# Patient Record
Sex: Female | Born: 1980
Health system: Southern US, Community
[De-identification: ages and names within clinical notes are randomized; demographics above are authoritative.]

## PROBLEM LIST (undated history)

## (undated) DIAGNOSIS — F32A Depression, unspecified: Secondary | ICD-10-CM

## (undated) DIAGNOSIS — K859 Acute pancreatitis without necrosis or infection, unspecified: Secondary | ICD-10-CM

## (undated) DIAGNOSIS — E119 Type 2 diabetes mellitus without complications: Secondary | ICD-10-CM

## (undated) DIAGNOSIS — G7111 Myotonic muscular dystrophy: Secondary | ICD-10-CM

## (undated) DIAGNOSIS — F329 Major depressive disorder, single episode, unspecified: Secondary | ICD-10-CM

## (undated) DIAGNOSIS — F419 Anxiety disorder, unspecified: Secondary | ICD-10-CM

## (undated) DIAGNOSIS — E785 Hyperlipidemia, unspecified: Secondary | ICD-10-CM

## (undated) HISTORY — DX: Type 2 diabetes mellitus without complications: E11.9

## (undated) HISTORY — PX: ABDOMINAL HYSTERECTOMY: SHX81

## (undated) HISTORY — DX: Anxiety disorder, unspecified: F41.9

## (undated) HISTORY — DX: Major depressive disorder, single episode, unspecified: F32.9

## (undated) HISTORY — DX: Depression, unspecified: F32.A

---

## 2004-03-21 ENCOUNTER — Emergency Department: Payer: Self-pay | Admitting: Unknown Physician Specialty

## 2009-07-12 ENCOUNTER — Emergency Department: Payer: Self-pay

## 2011-03-02 ENCOUNTER — Ambulatory Visit: Payer: Self-pay | Admitting: Obstetrics and Gynecology

## 2011-03-02 LAB — PREGNANCY, URINE: Pregnancy Test, Urine: NEGATIVE m[IU]/mL

## 2011-03-08 ENCOUNTER — Ambulatory Visit: Payer: Self-pay | Admitting: Obstetrics and Gynecology

## 2011-03-08 LAB — PREGNANCY, URINE: Pregnancy Test, Urine: NEGATIVE m[IU]/mL

## 2011-03-09 LAB — HEMOGLOBIN: HGB: 13.3 g/dL (ref 12.0–16.0)

## 2013-03-26 DIAGNOSIS — I7300 Raynaud's syndrome without gangrene: Secondary | ICD-10-CM | POA: Insufficient documentation

## 2013-03-26 DIAGNOSIS — Z5181 Encounter for therapeutic drug level monitoring: Secondary | ICD-10-CM | POA: Insufficient documentation

## 2013-03-26 DIAGNOSIS — G894 Chronic pain syndrome: Secondary | ICD-10-CM | POA: Insufficient documentation

## 2013-03-26 DIAGNOSIS — G7111 Myotonic muscular dystrophy: Secondary | ICD-10-CM | POA: Insufficient documentation

## 2013-07-23 DIAGNOSIS — F1490 Cocaine use, unspecified, uncomplicated: Secondary | ICD-10-CM | POA: Insufficient documentation

## 2013-09-29 ENCOUNTER — Encounter: Payer: Self-pay | Admitting: Neurology

## 2013-10-21 DIAGNOSIS — M1611 Unilateral primary osteoarthritis, right hip: Secondary | ICD-10-CM | POA: Insufficient documentation

## 2013-10-22 ENCOUNTER — Encounter: Payer: Self-pay | Admitting: Neurology

## 2013-12-23 ENCOUNTER — Ambulatory Visit: Payer: Self-pay | Admitting: Unknown Physician Specialty

## 2013-12-29 ENCOUNTER — Emergency Department: Payer: Self-pay | Admitting: Emergency Medicine

## 2013-12-29 LAB — COMPREHENSIVE METABOLIC PANEL
ALK PHOS: 63 U/L
ANION GAP: 2 — AB (ref 7–16)
AST: 18 U/L (ref 15–37)
Albumin: 3.8 g/dL (ref 3.4–5.0)
BUN: 11 mg/dL (ref 7–18)
Bilirubin,Total: 0.6 mg/dL (ref 0.2–1.0)
CHLORIDE: 106 mmol/L (ref 98–107)
CO2: 32 mmol/L (ref 21–32)
CREATININE: 0.77 mg/dL (ref 0.60–1.30)
Calcium, Total: 9.3 mg/dL (ref 8.5–10.1)
EGFR (African American): >60
Glucose: 115 mg/dL — ABNORMAL HIGH (ref 65–99)
OSMOLALITY: 280 (ref 275–301)
Potassium: 4.7 mmol/L (ref 3.5–5.1)
SGPT (ALT): 24 U/L
Sodium: 140 mmol/L (ref 136–145)
Total Protein: 6.8 g/dL (ref 6.4–8.2)

## 2013-12-29 LAB — CBC
HCT: 47.9 % — AB (ref 35.0–47.0)
HGB: 15.9 g/dL (ref 12.0–16.0)
MCH: 30.7 pg (ref 26.0–34.0)
MCHC: 33.2 g/dL (ref 32.0–36.0)
MCV: 92 fL (ref 80–100)
Platelet: 255 10*3/uL (ref 150–440)
RBC: 5.18 10*6/uL (ref 3.80–5.20)
RDW: 13 % (ref 11.5–14.5)
WBC: 9.6 10*3/uL (ref 3.6–11.0)

## 2014-01-22 HISTORY — PX: HIP SURGERY: SHX245

## 2014-02-09 ENCOUNTER — Ambulatory Visit: Payer: Self-pay | Admitting: Orthopedic Surgery

## 2014-03-22 DIAGNOSIS — M25851 Other specified joint disorders, right hip: Secondary | ICD-10-CM | POA: Insufficient documentation

## 2014-04-13 ENCOUNTER — Ambulatory Visit: Payer: Self-pay | Admitting: Radiology

## 2014-05-16 NOTE — Op Note (Signed)
PATIENT NAME:  Meredith Williams, WHITBY MR#:  [id number] DATE OF BIRTH:  1981-01-13  DATE OF PROCEDURE:  03/08/2011  PREOPERATIVE DIAGNOSES: Desires sterility, menorrhagia, and severe dysmenorrhea.   POSTOPERATIVE DIAGNOSES: Desires sterility, menorrhagia, and severe dysmenorrhea.   PROCEDURE: Laparoscopic total hysterectomy.   PRIMARY SURGEON: Vena Austria, MD  ASSISTANT: Thomasene Mohair, MD   ANESTHESIA: General endotracheal.  ESTIMATED BLOOD LOSS: 25 mL.  OPERATIVE FLUIDS: 800 mL of crystalloid.   IMPLANTS: None.   DRAINS OR CATHETERS: Foley to gravity drainage.   SPECIMENS REMOVED: Uterus and cervix.   COMPLICATIONS: None.   PATIENT CONDITION FOLLOWING PROCEDURE: Stable.   INTRAOPERATIVE FINDINGS: Normal-appearing nulliparous cervix, small uterus, normal tubes and ovaries bilaterally. General inspection of the intraperitoneal organs revealed no gross abnormalities.   PROCEDURE IN DETAIL: The risks, benefits, and alternatives of the procedure were discussed with the patient prior to proceeding to the OR. She voiced her desire to proceed with surgery. She was taken to the operating room and placed under general endotracheal anesthesia. The patient was then positioned in the dorsal lithotomy position and prepped and draped in the usual sterile fashion. A time out was performed. A Foley catheter was placed to drain the patient's bladder. Next, a sterile speculum was placed. The cervix was visualized. The inferior lip was grasped with a single-tooth tenaculum. A medium V-care was attempted to be placed; however, the cervix required some dilation in order to place. Upon successful placement of the V-care device, attention was turned to the patient's abdomen. A stab incision was made at the base of the umbilicus, a Veress needle was inserted, and insufflation was begun. Of note, opening pressure was noted to be 7 and the water drop test was negative. Upon establishing pneumoperitoneum, a  Visiport  5 mm trocar was placed through the base of the umbilicus. Following this two lateral 5 mm ports were placed under direct visualization. Attention was then turned to the right cornual region. The fallopian tube, round ligament, and utero-ovarian ligaments were transected using a 5 mm LigaSure device. The anterior leaf of the broad ligament was then incised down to the level of the internal cervical os. The posterior peritoneum was skeletonized as well. Next, attention was turned to the patient's left cornual region and it was dissected in a similar manner. The anterior leaf of the broad ligament was dissected down to the level of the internal cervical os, as had been done on the right. A bladder flap was created. The left uterine artery was then cauterized using bipolar energy. It was then transected using the LigaSure device. Attention was then turned to the patient's right uterine artery which was also cauterized and transected in a similar manner. The bladder blade flap was reinspected and noted to be well off the cup of the V-Care. The uterine arteries were noted to be hemostatic. Colpotomy was made using the V care cup and the LigaSure device. The specimen was transected free and removed without difficulty. Cuff closure was performed vaginally using interrupted figure of eight sutures of 0 Vicryl. Following vaginal cuff closure, vaginal exam revealed an intact cuff without defect. The vaginal cuff was also inspected from above and noted to be hemostatic, other than a small area of peritoneum that was oozing in the middle of the cuff which was coagulated with bipolar energy. The 5 mm port, on the patient's right, had been increased to a 10 mm port during the course of the procedure. This was closed using a laparoscopic closure device.  The skin on the 10 mm port site was closed using 4-0 Monocryl. The remainder of the incisions were dressed using Dermabond. Sponge, needle, and instrument counts were correct  x2. ____________________________ Florina OuAndreas M. Bonney AidStaebler, MD ams:slb D: 03/09/2011 08:35:15 ET     T: 03/09/2011 11:12:06 ET        JOB#: [id number] cc: Florina OuAndreas M. Bonney AidStaebler, MD, <Dictator> Carmel SacramentoANDREAS Cathrine MusterM Baudelia Schroepfer MD ELECTRONICALLY SIGNED 03/12/2011 13:46

## 2014-08-09 DIAGNOSIS — G43719 Chronic migraine without aura, intractable, without status migrainosus: Secondary | ICD-10-CM | POA: Insufficient documentation

## 2015-01-04 DIAGNOSIS — E781 Pure hyperglyceridemia: Secondary | ICD-10-CM | POA: Insufficient documentation

## 2015-09-27 DIAGNOSIS — E559 Vitamin D deficiency, unspecified: Secondary | ICD-10-CM | POA: Insufficient documentation

## 2015-09-27 DIAGNOSIS — E538 Deficiency of other specified B group vitamins: Secondary | ICD-10-CM | POA: Insufficient documentation

## 2015-12-07 ENCOUNTER — Other Ambulatory Visit: Payer: Self-pay | Admitting: Neurology

## 2015-12-07 DIAGNOSIS — G43719 Chronic migraine without aura, intractable, without status migrainosus: Secondary | ICD-10-CM

## 2015-12-23 ENCOUNTER — Ambulatory Visit: Payer: Medicare Other

## 2015-12-29 ENCOUNTER — Ambulatory Visit: Admission: RE | Admit: 2015-12-29 | Payer: Medicare Other | Source: Ambulatory Visit

## 2016-01-07 ENCOUNTER — Ambulatory Visit
Admission: RE | Admit: 2016-01-07 | Discharge: 2016-01-07 | Disposition: A | Discharge status: Home / Self Care | Payer: Medicare Other | Source: Ambulatory Visit | Attending: Neurology | Admitting: Neurology

## 2016-01-07 ENCOUNTER — Encounter: Payer: Self-pay | Admitting: Radiology

## 2016-01-07 DIAGNOSIS — G43719 Chronic migraine without aura, intractable, without status migrainosus: Secondary | ICD-10-CM | POA: Insufficient documentation

## 2016-01-07 MED ORDER — GADOBENATE DIMEGLUMINE 529 MG/ML IV SOLN
17.0000 mL | Freq: Once | INTRAVENOUS | Status: AC | PRN
  Administered 2016-01-07: 17 mL via INTRAVENOUS

## 2016-03-01 ENCOUNTER — Ambulatory Visit: Payer: Medicare Other | Attending: Neurology

## 2016-03-01 DIAGNOSIS — G4733 Obstructive sleep apnea (adult) (pediatric): Secondary | ICD-10-CM | POA: Insufficient documentation

## 2016-03-22 ENCOUNTER — Ambulatory Visit: Payer: Medicare Other | Attending: Neurology

## 2016-03-22 DIAGNOSIS — G4733 Obstructive sleep apnea (adult) (pediatric): Secondary | ICD-10-CM | POA: Diagnosis present

## 2016-06-15 ENCOUNTER — Emergency Department: Payer: Medicare Other

## 2016-06-15 ENCOUNTER — Encounter: Payer: Self-pay | Admitting: Intensive Care

## 2016-06-15 ENCOUNTER — Inpatient Hospital Stay
Admission: EM | Admit: 2016-06-15 | Discharge: 2016-06-17 | DRG: 439 | Disposition: A | Discharge status: Home / Self Care | Payer: Medicare Other | Attending: Internal Medicine | Admitting: Internal Medicine

## 2016-06-15 DIAGNOSIS — G710 Muscular dystrophy: Secondary | ICD-10-CM | POA: Diagnosis present

## 2016-06-15 DIAGNOSIS — E669 Obesity, unspecified: Secondary | ICD-10-CM | POA: Diagnosis present

## 2016-06-15 DIAGNOSIS — Z79899 Other long term (current) drug therapy: Secondary | ICD-10-CM

## 2016-06-15 DIAGNOSIS — D72829 Elevated white blood cell count, unspecified: Secondary | ICD-10-CM | POA: Diagnosis present

## 2016-06-15 DIAGNOSIS — Z6833 Body mass index (BMI) 33.0-33.9, adult: Secondary | ICD-10-CM

## 2016-06-15 DIAGNOSIS — Z7722 Contact with and (suspected) exposure to environmental tobacco smoke (acute) (chronic): Secondary | ICD-10-CM | POA: Diagnosis present

## 2016-06-15 DIAGNOSIS — F419 Anxiety disorder, unspecified: Secondary | ICD-10-CM | POA: Diagnosis present

## 2016-06-15 DIAGNOSIS — K859 Acute pancreatitis without necrosis or infection, unspecified: Secondary | ICD-10-CM | POA: Diagnosis not present

## 2016-06-15 DIAGNOSIS — E785 Hyperlipidemia, unspecified: Secondary | ICD-10-CM | POA: Diagnosis present

## 2016-06-15 HISTORY — DX: Myotonic muscular dystrophy: G71.11

## 2016-06-15 HISTORY — DX: Hyperlipidemia, unspecified: E78.5

## 2016-06-15 LAB — LIPASE, BLOOD: LIPASE: 353 U/L — AB (ref 11–51)

## 2016-06-15 LAB — CBC
HCT: 45.2 % (ref 35.0–47.0)
HEMOGLOBIN: 15.4 g/dL (ref 12.0–16.0)
MCH: 30.6 pg (ref 26.0–34.0)
MCHC: 34.1 g/dL (ref 32.0–36.0)
MCV: 89.7 fL (ref 80.0–100.0)
PLATELETS: 328 10*3/uL (ref 150–440)
RBC: 5.04 MIL/uL (ref 3.80–5.20)
RDW: 13.8 % (ref 11.5–14.5)
WBC: 17.3 10*3/uL — ABNORMAL HIGH (ref 3.6–11.0)

## 2016-06-15 LAB — BASIC METABOLIC PANEL
ANION GAP: 9 (ref 5–15)
BUN: 11 mg/dL (ref 6–20)
CALCIUM: 9.5 mg/dL (ref 8.9–10.3)
CO2: 26 mmol/L (ref 22–32)
CREATININE: 1.18 mg/dL — AB (ref 0.44–1.00)
Chloride: 99 mmol/L — ABNORMAL LOW (ref 101–111)
GFR calc Af Amer: >60 mL/min (ref >60)
GFR, EST NON AFRICAN AMERICAN: 59 mL/min — AB (ref >60)
Glucose, Bld: 214 mg/dL — ABNORMAL HIGH (ref 65–99)
Potassium: 4 mmol/L (ref 3.5–5.1)
SODIUM: 134 mmol/L — AB (ref 135–145)

## 2016-06-15 LAB — HEPATIC FUNCTION PANEL
ALBUMIN: 4 g/dL (ref 3.5–5.0)
ALT: 23 U/L (ref 14–54)
AST: 21 U/L (ref 15–41)
Alkaline Phosphatase: 38 U/L (ref 38–126)
BILIRUBIN DIRECT: 0.2 mg/dL (ref 0.1–0.5)
Indirect Bilirubin: 1.1 mg/dL — ABNORMAL HIGH (ref 0.3–0.9)
Total Bilirubin: 1.3 mg/dL — ABNORMAL HIGH (ref 0.3–1.2)
Total Protein: 7.7 g/dL (ref 6.5–8.1)

## 2016-06-15 LAB — LIPID PANEL
CHOL/HDL RATIO: 3.8 ratio
Cholesterol: 168 mg/dL (ref 0–200)
HDL: 44 mg/dL (ref >40)
LDL CALC: 67 mg/dL (ref 0–99)
Triglycerides: 285 mg/dL — ABNORMAL HIGH (ref <150)
VLDL: 57 mg/dL — ABNORMAL HIGH (ref 0–40)

## 2016-06-15 LAB — TROPONIN I

## 2016-06-15 LAB — LACTIC ACID, PLASMA: LACTIC ACID, VENOUS: 0.9 mmol/L (ref 0.5–1.9)

## 2016-06-15 MED ORDER — HEPARIN SODIUM (PORCINE) 5000 UNIT/ML IJ SOLN
5000.0000 [IU] | Freq: Three times a day (TID) | INTRAMUSCULAR | Status: DC
  Filled 2016-06-15 (×2): qty 1

## 2016-06-15 MED ORDER — BUSPIRONE HCL 10 MG PO TABS
15.0000 mg | ORAL_TABLET | Freq: Two times a day (BID) | ORAL | Status: DC
  Filled 2016-06-15: qty 2

## 2016-06-15 MED ORDER — IOPAMIDOL (ISOVUE-300) INJECTION 61%
30.0000 mL | Freq: Once | INTRAVENOUS | Status: AC | PRN
  Administered 2016-06-15: 30 mL via ORAL

## 2016-06-15 MED ORDER — IOPAMIDOL (ISOVUE-300) INJECTION 61%
100.0000 mL | Freq: Once | INTRAVENOUS | Status: AC | PRN
  Administered 2016-06-15: 100 mL via INTRAVENOUS

## 2016-06-15 MED ORDER — SODIUM CHLORIDE 0.9 % IV BOLUS (SEPSIS)
1000.0000 mL | Freq: Once | INTRAVENOUS | Status: AC
  Administered 2016-06-15: 1000 mL via INTRAVENOUS

## 2016-06-15 MED ORDER — IOPAMIDOL (ISOVUE-300) INJECTION 61%: 100.0000 mL | Freq: Once | INTRAVENOUS | Status: DC | PRN

## 2016-06-15 MED ORDER — DULOXETINE HCL 60 MG PO CPEP
60.0000 mg | ORAL_CAPSULE | Freq: Two times a day (BID) | ORAL | Status: DC
  Filled 2016-06-15: qty 1

## 2016-06-15 MED ORDER — FENOFIBRATE 160 MG PO TABS
160.0000 mg | ORAL_TABLET | Freq: Every day | ORAL | Status: DC
  Filled 2016-06-15 (×2): qty 1

## 2016-06-15 MED ORDER — MORPHINE SULFATE (PF) 4 MG/ML IV SOLN
INTRAVENOUS | Status: AC
  Filled 2016-06-15: qty 1

## 2016-06-15 MED ORDER — GABAPENTIN 300 MG PO CAPS
900.0000 mg | ORAL_CAPSULE | Freq: Two times a day (BID) | ORAL | Status: DC
  Filled 2016-06-15: qty 3

## 2016-06-15 MED ORDER — DOCUSATE SODIUM 100 MG PO CAPS: 100.0000 mg | ORAL_CAPSULE | Freq: Two times a day (BID) | ORAL | Status: DC | PRN

## 2016-06-15 MED ORDER — MORPHINE SULFATE (PF) 4 MG/ML IV SOLN
4.0000 mg | Freq: Once | INTRAVENOUS | Status: AC
  Administered 2016-06-15: 4 mg via INTRAVENOUS
  Filled 2016-06-15: qty 1

## 2016-06-15 MED ORDER — ACETAMINOPHEN 325 MG PO TABS
650.0000 mg | ORAL_TABLET | Freq: Four times a day (QID) | ORAL | Status: DC | PRN
  Administered 2016-06-15: 650 mg via ORAL
  Filled 2016-06-15: qty 2

## 2016-06-15 MED ORDER — ONDANSETRON HCL 4 MG/2ML IJ SOLN
4.0000 mg | Freq: Four times a day (QID) | INTRAMUSCULAR | Status: DC | PRN
  Administered 2016-06-17: 4 mg via INTRAVENOUS
  Filled 2016-06-15: qty 2

## 2016-06-15 MED ORDER — ZOLPIDEM TARTRATE 5 MG PO TABS: 5.0000 mg | ORAL_TABLET | Freq: Every evening | ORAL | Status: DC | PRN

## 2016-06-15 MED ORDER — CYCLOBENZAPRINE HCL 10 MG PO TABS: 15.0000 mg | ORAL_TABLET | Freq: Two times a day (BID) | ORAL | Status: DC | PRN

## 2016-06-15 MED ORDER — SODIUM CHLORIDE 0.9 % IV SOLN
INTRAVENOUS | Status: DC
  Administered 2016-06-15 – 2016-06-17 (×6): via INTRAVENOUS

## 2016-06-15 MED ORDER — MORPHINE SULFATE (PF) 2 MG/ML IV SOLN
2.0000 mg | Freq: Once | INTRAVENOUS | Status: AC
  Administered 2016-06-15: 2 mg via INTRAVENOUS

## 2016-06-15 MED ORDER — MORPHINE SULFATE (PF) 2 MG/ML IV SOLN
2.0000 mg | INTRAVENOUS | Status: DC | PRN
  Administered 2016-06-15 – 2016-06-17 (×10): 2 mg via INTRAVENOUS
  Filled 2016-06-15 (×10): qty 1

## 2016-06-15 NOTE — H&P (Signed)
[facility] [place] [facility]   PATIENT NAME: Meredith BucklerSarah Williams    MR#:  [id number]  DATE OF BIRTH:  17-Aug-1980  DATE OF ADMISSION:  06/15/2016  PRIMARY CARE PHYSICIAN: Marisue IvanLinthavong, Kanhka, MD   REQUESTING/REFERRING PHYSICIAN: Quale  CHIEF COMPLAINT:   Chief Complaint  Patient presents with  . Chest Pain    HISTORY OF PRESENT ILLNESS: Meredith Williams  is a 36 y.o. female with a known history of Hyperlipidemia, anxiety and muscular dystrophy- since yesterday started having some pain and nausea in her abdomen, the pain is nonradiating and she does not have any associated fever or chills. She denies any diarrhea. Concerned with this she came to emergency room and her lipase was elevated and CT scan of the abdomen showed acute pancreatitis. She denies drinking regular alcohol.  PAST MEDICAL HISTORY:   Past Medical History:  Diagnosis Date  . Hyperlipidemia   . Muscular dystrophy, myotonic (HCC)     PAST SURGICAL HISTORY: History reviewed. No pertinent surgical history.  SOCIAL HISTORY:  Social History  Substance Use Topics  . Smoking status: Passive Smoke Exposure - Never Smoker  . Smokeless tobacco: Never Used  . Alcohol use 2.4 oz/week    4 Shots of liquor per week    FAMILY HISTORY:  Family History  Problem Relation Age of Onset  . Stroke Mother     DRUG ALLERGIES: No Known Allergies  REVIEW OF SYSTEMS:   CONSTITUTIONAL: No fever, fatigue or weakness.  EYES: No blurred or double vision.  EARS, NOSE, AND THROAT: No tinnitus or ear pain.  RESPIRATORY: No cough, shortness of breath, wheezing or hemoptysis.  CARDIOVASCULAR: No chest pain, orthopnea, edema.  GASTROINTESTINAL: No nausea, vomiting, diarrhea , have abdominal pain.  GENITOURINARY: No dysuria, hematuria.  ENDOCRINE: No polyuria, nocturia,  HEMATOLOGY: No anemia, easy bruising or bleeding SKIN: No rash or lesion. MUSCULOSKELETAL: No joint pain or arthritis.   NEUROLOGIC: No tingling, numbness,  weakness.  PSYCHIATRY: No anxiety or depression.   MEDICATIONS AT HOME:  Prior to Admission medications   Medication Sig Start Date End Date Taking? Authorizing Provider  busPIRone (BUSPAR) 15 MG tablet Take 15 mg by mouth 2 (two) times daily. 06/06/16  Yes [provider]  cyclobenzaprine (FLEXERIL) 10 MG tablet Take 15 mg by mouth 2 (two) times daily as needed for muscle spasms. 05/29/16  Yes [provider]  DULoxetine (CYMBALTA) 60 MG capsule Take 60 mg by mouth 2 (two) times daily. 04/18/16  Yes [provider]  fenofibrate 160 MG tablet Take 160 mg by mouth daily. 06/05/16  Yes [provider]  gabapentin (NEURONTIN) 300 MG capsule Take 900 mg by mouth 2 (two) times daily.  05/25/16  Yes [provider]  zolpidem (AMBIEN) 5 MG tablet Take 5 mg by mouth at bedtime. 05/21/16  Yes [provider]      PHYSICAL EXAMINATION:   VITAL SIGNS: Blood pressure 106/68, pulse (!) 118, temperature 99.1 F (37.3 C), temperature source Oral, resp. rate 20, height 5\' 5"  (1.651 m), weight 92 kg (202 lb 14.4 oz), SpO2 91 %.  GENERAL:  36 y.o.-year-old patient lying in the bed with no acute distress.  EYES: Pupils equal, round, reactive to light and accommodation. No scleral icterus. Extraocular muscles intact.  HEENT: Head atraumatic, normocephalic. Oropharynx and nasopharynx clear.  NECK:  Supple, no jugular venous distention. No thyroid enlargement, no tenderness.  LUNGS: Normal breath sounds bilaterally, no wheezing, rales,rhonchi or crepitation. No use of accessory muscles  of respiration.  CARDIOVASCULAR: S1, S2 normal. No murmurs, rubs, or gallops.  ABDOMEN: Soft, Epigastric tender, nondistended. Bowel sounds present. No organomegaly or mass.  EXTREMITIES: No pedal edema, cyanosis, or clubbing.  NEUROLOGIC: Cranial nerves II through XII are intact. Muscle strength 5/5 in all extremities. Sensation intact. Gait not checked.  PSYCHIATRIC: The patient  is alert and oriented x 3.  SKIN: No obvious rash, lesion, or ulcer.   LABORATORY PANEL:   CBC  Recent Labs Lab 06/15/16 0748  WBC 17.3*  HGB 15.4  HCT 45.2  PLT 328  MCV 89.7  MCH 30.6  MCHC 34.1  RDW 13.8   ------------------------------------------------------------------------------------------------------------------  Chemistries   Recent Labs Lab 06/15/16 0748  NA 134*  K 4.0  CL 99*  CO2 26  GLUCOSE 214*  BUN 11  CREATININE 1.18*  CALCIUM 9.5  AST 21  ALT 23  ALKPHOS 38  BILITOT 1.3*   ------------------------------------------------------------------------------------------------------------------ estimated creatinine clearance is 74.6 mL/min (A) (by C-G formula based on SCr of 1.18 mg/dL (H)). ------------------------------------------------------------------------------------------------------------------ No results for input(s): TSH, T4TOTAL, T3FREE, THYROIDAB in the last 72 hours.  Invalid input(s): FREET3   Coagulation profile No results for input(s): INR, PROTIME in the last 168 hours. ------------------------------------------------------------------------------------------------------------------- No results for input(s): DDIMER in the last 72 hours. -------------------------------------------------------------------------------------------------------------------  Cardiac Enzymes  Recent Labs Lab 06/15/16 0748  TROPONINI <0.03   ------------------------------------------------------------------------------------------------------------------ Invalid input(s): POCBNP  ---------------------------------------------------------------------------------------------------------------  Urinalysis No results found for: COLORURINE, APPEARANCEUR, LABSPEC, PHURINE, GLUCOSEU, HGBUR, BILIRUBINUR, KETONESUR, PROTEINUR, UROBILINOGEN, NITRITE, LEUKOCYTESUR   RADIOLOGY: Dg Chest 2 View  Result Date: 06/15/2016 CLINICAL DATA:  Pt having chest pain  under left breast that radiates down her left side since Wednesday. Hx of bronchitis, muscular dystrophy. Non smoker. She has a nipple ring but couldn't take it out. EXAM: CHEST - 2 VIEW COMPARISON:  none FINDINGS: Relatively low lung volumes. Coarse airspace consolidation in basilar segments of the left lower lobe with probable small effusion. There is some subsegmental atelectasis/ consolidation in the right infrahilar region as well. Linear scarring or subsegmental atelectasis in the right middle lobe and lingula. Heart size and mediastinal contours are within normal limits. No pneumothorax. Visualized bones unremarkable. IMPRESSION: 1. Bibasilar airspace disease, left greater than right. 2. Small Left pleural effusion. Electronically Signed   By: Corlis Leak M.D.   On: 06/15/2016 08:10   Ct Abdomen Pelvis W Contrast  Result Date: 06/15/2016 CLINICAL DATA:  Left chest/ upper abdominal pain EXAM: CT ABDOMEN AND PELVIS WITH CONTRAST TECHNIQUE: Multidetector CT imaging of the abdomen and pelvis was performed using the standard protocol following bolus administration of intravenous contrast. CONTRAST:  ISOVUE-300 IOPAMIDOL (ISOVUE-300) INJECTION 61% COMPARISON:  None. FINDINGS: Lower chest: Mild patchy opacities in the bilateral lower lobes, likely atelectasis. Hepatobiliary: Liver is within normal limits. Gallbladder is unremarkable. No intrahepatic or extrahepatic ductal dilatation. Pancreas: Peripancreatic inflammatory changes, reflecting acute pancreatitis. No evidence of pancreatic necrosis. Peripancreatic fluid along the pancreatic tail (series 2/ image 27), without discrete pancreatic pseudocyst. Spleen: Within normal limits. Adrenals/Urinary Tract: Adrenal glands within normal limits. Severe left renal cortical scarring/ atrophy. Right kidney is within normal limits. No hydronephrosis. Bladder is within normal limits. Stomach/Bowel: Stomach is within normal limits. No evidence of bowel obstruction.  Normal appendix (series 2/ image 68). Vascular/Lymphatic: No evidence of abdominal aortic aneurysm. No suspicious abdominopelvic lymphadenopathy. Reproductive: Status post hysterectomy. Bilateral ovaries are within normal limits. Other: Peripancreatic fluid, as noted above. Otherwise, no abdominopelvic ascites. Musculoskeletal: Visualized osseous structures are within normal  limits. IMPRESSION: Acute uncomplicated pancreatitis. Electronically Signed   By: Charline Bills M.D.   On: 06/15/2016 11:35    EKG: Orders placed or performed during the hospital encounter of 06/15/16  . EKG 12-Lead  . EKG 12-Lead  . ED EKG within 10 minutes  . ED EKG within 10 minutes    IMPRESSION AND PLAN:  * Acute pancreatitis   IV fluid and pain management with   Nausea medicine as needed.   Check lipid panel.  * Anxiety   Muscular dystrophy    Continue home medications.   All the records are reviewed and case discussed with ED provider. Management plans discussed with the patient, family and they are in agreement.  CODE STATUS: Full code    Code Status Orders        Start     Ordered   06/15/16 1529  Full code  Continuous     06/15/16 1528    Code Status History    Date Active Date Inactive Code Status Order ID Comments User Context   This patient has a current code status but no historical code status.       TOTAL TIME TAKING CARE OF THIS PATIENT: 50 minutes.    Altamese Dilling M.D on 06/15/2016   Between 7am to 6pm - Pager - [phone]  After 6pm go to [email or web address] - password [email or web address]  [facility]  Office  [phone]  CC: Primary care physician; Marisue Ivan, MD   Note: This dictation was prepared with Dragon dictation along with smaller phrase technology. Any transcriptional errors that result from this process are unintentional.

## 2016-06-15 NOTE — ED Provider Notes (Signed)
[facility] Emergency Department Provider Note   ____________________________________________   First MD Initiated Contact with Patient 06/15/16 0820     (approximate)  I have reviewed the triage vital signs and the nursing notes.   HISTORY  Chief Complaint Left-sided abdominal pain   HPI Meredith Williams is a 36 y.o. female and reports to me that she's been having increasing pain in the left mid to upper abdomen. She reports that pain and discomfort starts at the lower edge of her left breast but runs all the way down to the left lower pelvis. Been slowly increasing over the last day. Associated with mild nausea and decreased appetite. No fevers or vomiting. No diarrhea.  As she does report that she did mention that her chest was slightly uncomfortable and the pain starts in the area of the breast, but she reports her pain really is primarily in the left side of the abdomen. No shortness of breath. No cough.  Moderate, somewhat sharp pain with movement.  Prior left hip surgery, prior hysterectomy. Has muscular dystrophy.   Past Medical History:  Diagnosis Date  . Hyperlipidemia   . Muscular dystrophy, myotonic (HCC)     There are no active problems to display for this patient.   History reviewed. No pertinent surgical history.  Prior to Admission medications   Medication Sig Start Date End Date Taking? Authorizing Provider  busPIRone (BUSPAR) 15 MG tablet Take 15 mg by mouth 2 (two) times daily. 06/06/16  Yes [provider]  cyclobenzaprine (FLEXERIL) 10 MG tablet Take 15 mg by mouth 2 (two) times daily as needed for muscle spasms. 05/29/16  Yes [provider]  DULoxetine (CYMBALTA) 60 MG capsule Take 60 mg by mouth 2 (two) times daily. 04/18/16  Yes [provider]  fenofibrate 160 MG tablet Take 160 mg by mouth daily. 06/05/16  Yes [provider]  gabapentin (NEURONTIN) 300 MG capsule Take 900 mg by mouth 2 (two)  times daily.  05/25/16  Yes [provider]  zolpidem (AMBIEN) 5 MG tablet Take 5 mg by mouth at bedtime. 05/21/16  Yes [provider]    Allergies Patient has no known allergies.  History reviewed. No pertinent family history.  Social History Social History  Substance Use Topics  . Smoking status: Passive Smoke Exposure - Never Smoker  . Smokeless tobacco: Never Used  . Alcohol use 2.4 oz/week    4 Shots of liquor per week    Review of Systems Constitutional: No fever/chills. Some "hot flashes" in the evening Eyes: No visual changes. ENT: No sore throat. Cardiovascular: Denies chest pain. Respiratory: Denies shortness of breath. Gastrointestinal: No vomiting.  No diarrhea.  No constipation. Genitourinary: Negative for dysuria. Musculoskeletal: Negative for back pain. Skin: Negative for rash. Neurological: Negative for headaches, focal weakness or numbness.  10-point ROS otherwise negative.  ____________________________________________   PHYSICAL EXAM:  VITAL SIGNS: ED Triage Vitals  Enc Vitals Group     BP 06/15/16 0738 114/73     Pulse Rate 06/15/16 0738 (!) 139     Resp 06/15/16 0738 20     Temp 06/15/16 0738 99.5 F (37.5 C)     Temp Source 06/15/16 0738 Oral     SpO2 06/15/16 0738 90 %     Weight 06/15/16 0738 210 lb (95.3 kg)     Height 06/15/16 0738 5\' 5"  (1.651 m)     Head Circumference --      Peak Flow --  Pain Score 06/15/16 0747 10     Pain Loc --      Pain Edu? --      Excl. in GC? --     Constitutional: Alert and oriented. Well appearing and in no acute distress.She is very pleasant and is here with her father. Eyes: Conjunctivae are normal. PERRL. EOMI. Head: Atraumatic. Nose: No congestion/rhinnorhea. Mouth/Throat: Mucous membranes are slightly dry.  Oropharynx non-erythematous. Neck: No stridor.  No meningismus Cardiovascular: Tachycardic rate, regular rhythm. Grossly normal heart sounds.  Good peripheral  circulation. Respiratory: Normal respiratory effort.  No retractions. Lungs CTAB. Gastrointestinal: Soft and nontender over the right, but notably tender with some mild rebound peritonitis over the entirety of the left side of the abdomen, perhaps slightly worse over the left flank and left upper quadrant. No distention. No abdominal bruits. No CVA tenderness. Musculoskeletal: No lower extremity tenderness nor edema.  No joint effusions. Neurologic:  Normal speech and language. No gross focal neurologic deficits are appreciated.  Skin:  Skin is warm, dry and intact. No rash noted. Psychiatric: Mood and affect are normal. Speech and behavior are normal.  ____________________________________________   LABS (all labs ordered are listed, but only abnormal results are displayed)  Labs Reviewed  BASIC METABOLIC PANEL - Abnormal; Notable for the following:       Result Value   Sodium 134 (*)    Chloride 99 (*)    Glucose, Bld 214 (*)    Creatinine, Ser 1.18 (*)    GFR calc non Af Amer 59 (*)    All other components within normal limits  CBC - Abnormal; Notable for the following:    WBC 17.3 (*)    All other components within normal limits  HEPATIC FUNCTION PANEL - Abnormal; Notable for the following:    Total Bilirubin 1.3 (*)    Indirect Bilirubin 1.1 (*)    All other components within normal limits  LIPASE, BLOOD - Abnormal; Notable for the following:    Lipase 353 (*)    All other components within normal limits  TROPONIN I  LACTIC ACID, PLASMA  LACTIC ACID, PLASMA  URINALYSIS, COMPLETE (UACMP) WITH MICROSCOPIC   ____________________________________________  EKG  Reviewed and interpreted by me at 7:40 AM Ventricular rate 1:30 QRS 90 QTc 4:30 Sinus tachycardia, no evidence of acute ischemia is noted. ____________________________________________  RADIOLOGY  Dg Chest 2 View  Result Date: 06/15/2016 CLINICAL DATA:  Pt having chest pain under left breast that radiates down  her left side since Wednesday. Hx of bronchitis, muscular dystrophy. Non smoker. She has a nipple ring but couldn't take it out. EXAM: CHEST - 2 VIEW COMPARISON:  none FINDINGS: Relatively low lung volumes. Coarse airspace consolidation in basilar segments of the left lower lobe with probable small effusion. There is some subsegmental atelectasis/ consolidation in the right infrahilar region as well. Linear scarring or subsegmental atelectasis in the right middle lobe and lingula. Heart size and mediastinal contours are within normal limits. No pneumothorax. Visualized bones unremarkable. IMPRESSION: 1. Bibasilar airspace disease, left greater than right. 2. Small Left pleural effusion. Electronically Signed   By: Corlis Leak M.D.   On: 06/15/2016 08:10   Ct Abdomen Pelvis W Contrast  Result Date: 06/15/2016 CLINICAL DATA:  Left chest/ upper abdominal pain EXAM: CT ABDOMEN AND PELVIS WITH CONTRAST TECHNIQUE: Multidetector CT imaging of the abdomen and pelvis was performed using the standard protocol following bolus administration of intravenous contrast. CONTRAST:  ISOVUE-300 IOPAMIDOL (ISOVUE-300) INJECTION 61% COMPARISON:  None. FINDINGS: Lower chest: Mild patchy opacities in the bilateral lower lobes, likely atelectasis. Hepatobiliary: Liver is within normal limits. Gallbladder is unremarkable. No intrahepatic or extrahepatic ductal dilatation. Pancreas: Peripancreatic inflammatory changes, reflecting acute pancreatitis. No evidence of pancreatic necrosis. Peripancreatic fluid along the pancreatic tail (series 2/ image 27), without discrete pancreatic pseudocyst. Spleen: Within normal limits. Adrenals/Urinary Tract: Adrenal glands within normal limits. Severe left renal cortical scarring/ atrophy. Right kidney is within normal limits. No hydronephrosis. Bladder is within normal limits. Stomach/Bowel: Stomach is within normal limits. No evidence of bowel obstruction. Normal appendix (series 2/ image 68).  Vascular/Lymphatic: No evidence of abdominal aortic aneurysm. No suspicious abdominopelvic lymphadenopathy. Reproductive: Status post hysterectomy. Bilateral ovaries are within normal limits. Other: Peripancreatic fluid, as noted above. Otherwise, no abdominopelvic ascites. Musculoskeletal: Visualized osseous structures are within normal limits. IMPRESSION: Acute uncomplicated pancreatitis. Electronically Signed   By: Charline Bills M.D.   On: 06/15/2016 11:35    ____________________________________________   PROCEDURES  Procedure(s) performed: None  Procedures  Critical Care performed: No  ____________________________________________   INITIAL IMPRESSION / ASSESSMENT AND PLAN / ED COURSE  Pertinent labs & imaging results that were available during my care of the patient were reviewed by me and considered in my medical decision making (see chart for details).  Differential diagnosis includes but is not limited to, abdominal perforation, aortic dissection, cholecystitis, appendicitis, diverticulitis, colitis, esophagitis/gastritis, kidney stone, pyelonephritis, urinary tract infection, aortic aneurysm. All are considered in decision and treatment plan. Based upon the patient's presentation and risk factors, and plan to proceed with CT scan of the abdomen and pelvis to further evaluate given the patient's concerns for possible peritonitis  ----------------------------------------- 12:15 PM on 06/15/2016 -----------------------------------------  Patient with ongoing tachycardia, pain started to return. She is awake and alert in no acute distress. Discussed with the patient I will admit her for further workup of her acute pancreatitis. Somewhat unclear as to the cause, she does use alcohol the last reports using about 2 weeks ago and does not drink daily. She does report history of elevated cholesterol and I'll check a lipid panel. I'm not certain if this could represent infectious  etiology, though the patient does not have a fever, denies frank associated infectious symptoms.  Admitted to hospitalist service for further evaluation and workup.       ____________________________________________   FINAL CLINICAL IMPRESSION(S) / ED DIAGNOSES  Final diagnoses:  Acute pancreatitis, unspecified complication status, unspecified pancreatitis type      NEW MEDICATIONS STARTED DURING THIS VISIT:  New Prescriptions   No medications on file     Note:  This document was prepared using Dragon voice recognition software and may include unintentional dictation errors.     Sharyn Creamer, MD 06/15/16 1216

## 2016-06-15 NOTE — ED Notes (Signed)
Pts o2 sats dropped to 79-82% on RA. Pt was instructed to sit up and take deep breaths without any improvement of sats. Pt was placed on 3L [place] and sats slowly came up to mid 90's. MD made aware.

## 2016-06-15 NOTE — ED Triage Notes (Signed)
Patient presents today with chest pain under L breast that radiates down to abdomen that started yesterday. Reports pain is so bad she couldn't sleep last night. HX myotonic muscular dystrophy. Diaphoretic in triage. Wears C-PAP at night. Denies SOB.

## 2016-06-16 LAB — BASIC METABOLIC PANEL
Anion gap: 5 (ref 5–15)
BUN: 7 mg/dL (ref 6–20)
CALCIUM: 8.1 mg/dL — AB (ref 8.9–10.3)
CHLORIDE: 106 mmol/L (ref 101–111)
CO2: 28 mmol/L (ref 22–32)
CREATININE: 0.89 mg/dL (ref 0.44–1.00)
GFR calc Af Amer: >60 mL/min (ref >60)
GFR calc non Af Amer: >60 mL/min (ref >60)
Glucose, Bld: 137 mg/dL — ABNORMAL HIGH (ref 65–99)
Potassium: 3.8 mmol/L (ref 3.5–5.1)
Sodium: 139 mmol/L (ref 135–145)

## 2016-06-16 LAB — CBC
HCT: 36.6 % (ref 35.0–47.0)
Hemoglobin: 12.4 g/dL (ref 12.0–16.0)
MCH: 31.1 pg (ref 26.0–34.0)
MCHC: 34 g/dL (ref 32.0–36.0)
MCV: 91.3 fL (ref 80.0–100.0)
PLATELETS: 253 10*3/uL (ref 150–440)
RBC: 4 MIL/uL (ref 3.80–5.20)
RDW: 13.6 % (ref 11.5–14.5)
WBC: 11.5 10*3/uL — AB (ref 3.6–11.0)

## 2016-06-16 LAB — LIPASE, BLOOD: Lipase: 101 U/L — ABNORMAL HIGH (ref 11–51)

## 2016-06-16 NOTE — Progress Notes (Addendum)
[facility] [place] at [facility]   PATIENT NAME: Meredith BucklerSarah Williams    MR#:  [id number]  DATE OF BIRTH:  04/14/1980  SUBJECTIVE:  CHIEF COMPLAINT:   Chief Complaint  Patient presents with  . Chest Pain   Still abdominal pain and nausea, no vomiting or diarrhea. REVIEW OF SYSTEMS:  Review of Systems  Constitutional: Negative for chills, fever and malaise/fatigue.  HENT: Negative for sore throat.   Eyes: Negative for blurred vision and double vision.  Respiratory: Negative for cough, shortness of breath, wheezing and stridor.   Cardiovascular: Negative for chest pain, palpitations and leg swelling.  Gastrointestinal: Positive for abdominal pain and nausea. Negative for blood in stool, diarrhea, melena and vomiting.  Genitourinary: Negative for dysuria and hematuria.  Musculoskeletal: Negative for back pain.  Neurological: Negative for dizziness, focal weakness, loss of consciousness, weakness and headaches.  Psychiatric/Behavioral: Negative for depression. The patient is not nervous/anxious.     DRUG ALLERGIES:  No Known Allergies VITALS:  Blood pressure 102/64, pulse (!) 121, temperature 99.9 F (37.7 C), temperature source Oral, resp. rate 20, height 5\' 5"  (1.651 m), weight 202 lb 14.4 oz (92 kg), SpO2 92 %. PHYSICAL EXAMINATION:  Physical Exam  Constitutional: She is oriented to person, place, and time. No distress.  Obese.  HENT:  Head: Normocephalic.  Mouth/Throat: Oropharynx is clear and moist.  Eyes: Conjunctivae and EOM are normal. No scleral icterus.  Neck: Normal range of motion. Neck supple. No JVD present. No tracheal deviation present.  Cardiovascular: Normal rate, regular rhythm and normal heart sounds.  Exam reveals no gallop.   No murmur heard. Pulmonary/Chest: Effort normal and breath sounds normal. No respiratory distress. She has no wheezes. She has no rales.  Abdominal: Soft. She exhibits no distension. There is tenderness. There is no  rebound and no guarding.  Abdominal pain on the middle and the left side.  Musculoskeletal: Normal range of motion. She exhibits no edema or tenderness.  Neurological: She is alert and oriented to person, place, and time. No cranial nerve deficit.  Skin: No rash noted. No erythema.  Psychiatric: Affect normal.   LABORATORY PANEL:  Female CBC  Recent Labs Lab 06/16/16 0327  WBC 11.5*  HGB 12.4  HCT 36.6  PLT 253   ------------------------------------------------------------------------------------------------------------------ Chemistries   Recent Labs Lab 06/15/16 0748 06/16/16 0327  NA 134* 139  K 4.0 3.8  CL 99* 106  CO2 26 28  GLUCOSE 214* 137*  BUN 11 7  CREATININE 1.18* 0.89  CALCIUM 9.5 8.1*  AST 21  --   ALT 23  --   ALKPHOS 38  --   BILITOT 1.3*  --    RADIOLOGY:  No results found. ASSESSMENT AND PLAN:   * Acute pancreatitis CT abdomen and pelvis show acute uncomplicated pancreatitis.   Start full liquid diet, continue IV fluid and pain management with   Nausea medicine as needed. Follow-up lipase in a.m.  Leukocytosis, due to reaction, improved.  *HLP. TG 285, LDL 67.  Obesity.  All the records are reviewed and case discussed with Care Management/Social Worker. Management plans discussed with the patient, family and they are in agreement.  CODE STATUS: Full Code  TOTAL TIME TAKING CARE OF THIS PATIENT: 27 minutes.   More than 50% of the time was spent in counseling/coordination of care: YES  POSSIBLE D/C IN 1-2 DAYS, DEPENDING ON CLINICAL CONDITION.   Shaune Pollackhen, Philopater Mucha M.D on 06/16/2016 at 2:04 PM  Between 7am to 6pm -  Pager - [phone]  After 6pm go to [email or web address] - Social research officer, government  [facility] [place] [facility]  Office  [phone]  CC: Primary care physician; Marisue Ivan, MD  Note: This dictation was prepared with Dragon dictation along with smaller phrase technology. Any transcriptional errors that result from  this process are unintentional.

## 2016-06-17 LAB — HIV ANTIBODY (ROUTINE TESTING W REFLEX): HIV Screen 4th Generation wRfx: NONREACTIVE

## 2016-06-17 LAB — LIPASE, BLOOD: Lipase: 29 U/L (ref 11–51)

## 2016-06-17 NOTE — Progress Notes (Signed)
Pt to be discharged per Md order. IV removed. Pt to be discharged per MD order. Instructions reviewed with pt. All instructions reviewed and pt verbalizes understanding. Pt prefers to walk down to be discharged.

## 2016-06-17 NOTE — Discharge Summary (Signed)
[facility] - [place] at [facility]   PATIENT NAME: Meredith Williams    MR#:  [id number]  DATE OF BIRTH:  October 06, 1980  DATE OF ADMISSION:  06/15/2016   ADMITTING PHYSICIAN: Altamese Dilling, MD  DATE OF DISCHARGE: 06/17/2016 11:52 AM  PRIMARY CARE PHYSICIAN: Marisue Ivan, MD   ADMISSION DIAGNOSIS:  Acute pancreatitis, unspecified complication status, unspecified pancreatitis type [K85.90] DISCHARGE DIAGNOSIS:  Principal Problem:   Acute pancreatitis  SECONDARY DIAGNOSIS:   Past Medical History:  Diagnosis Date  . Hyperlipidemia   . Muscular dystrophy, myotonic (HCC)    HOSPITAL COURSE:   * Acute pancreatitis CT abdomen and pelvis show acute uncomplicated pancreatitis. She tolerated liquid diet and low-fat diet. She has been treated with IV fluid and pain management with Nausea medicine as needed. Her symptoms has much improved. She has no complaints of abdominal pain, nausea, vomiting or diarrhea. Lipase improved to normal range.  Leukocytosis, due to reaction, improved.  *HLP. TG 285, LDL 67.  Obesity.  DISCHARGE CONDITIONS:  Stable, discharged to home today. CONSULTS OBTAINED:   DRUG ALLERGIES:  No Known Allergies DISCHARGE MEDICATIONS:   Allergies as of 06/17/2016   No Known Allergies     Medication List    TAKE these medications   busPIRone 15 MG tablet Commonly known as:  BUSPAR Take 15 mg by mouth 2 (two) times daily.   cyclobenzaprine 10 MG tablet Commonly known as:  FLEXERIL Take 15 mg by mouth 2 (two) times daily as needed for muscle spasms.   DULoxetine 60 MG capsule Commonly known as:  CYMBALTA Take 60 mg by mouth 2 (two) times daily.   fenofibrate 160 MG tablet Take 160 mg by mouth daily.   gabapentin 300 MG capsule Commonly known as:  NEURONTIN Take 900 mg by mouth 2 (two) times daily.   zolpidem 5 MG tablet Commonly known as:  AMBIEN Take 5 mg by mouth at bedtime.        DISCHARGE INSTRUCTIONS:    See AVS. If you experience worsening of your admission symptoms, develop shortness of breath, life threatening emergency, suicidal or homicidal thoughts you must seek medical attention immediately by calling 911 or calling your MD immediately  if symptoms less severe.  You Must read complete instructions/literature along with all the possible adverse reactions/side effects for all the Medicines you take and that have been prescribed to you. Take any new Medicines after you have completely understood and accpet all the possible adverse reactions/side effects.   Please note  You were cared for by a hospitalist during your hospital stay. If you have any questions about your discharge medications or the care you received while you were in the hospital after you are discharged, you can call the unit and asked to speak with the hospitalist on call if the hospitalist that took care of you is not available. Once you are discharged, your primary care physician will handle any further medical issues. Please note that NO REFILLS for any discharge medications will be authorized once you are discharged, as it is imperative that you return to your primary care physician (or establish a relationship with a primary care physician if you do not have one) for your aftercare needs so that they can reassess your need for medications and monitor your lab values.    On the day of Discharge:  VITAL SIGNS:  Blood pressure 120/78, pulse (!) 118, temperature 99.3 F (37.4 C), temperature source Oral, resp. rate (!) 24, height 5'  5" (1.651 m), weight 202 lb 14.4 oz (92 kg), SpO2 92 %. PHYSICAL EXAMINATION:  GENERAL:  36 y.o.-year-old patient lying in the bed with no acute distress. Obese. EYES: Pupils equal, round, reactive to light and accommodation. No scleral icterus. Extraocular muscles intact.  HEENT: Head atraumatic, normocephalic. Oropharynx and nasopharynx clear.  NECK:  Supple, no jugular venous distention. No  thyroid enlargement, no tenderness.  LUNGS: Normal breath sounds bilaterally, no wheezing, rales,rhonchi or crepitation. No use of accessory muscles of respiration.  CARDIOVASCULAR: S1, S2 normal. No murmurs, rubs, or gallops.  ABDOMEN: Soft, non-tender, non-distended. Bowel sounds present. No organomegaly or mass.  EXTREMITIES: No pedal edema, cyanosis, or clubbing.  NEUROLOGIC: Cranial nerves II through XII are intact. Muscle strength 5/5 in all extremities. Sensation intact. Gait not checked.  PSYCHIATRIC: The patient is alert and oriented x 3.  SKIN: No obvious rash, lesion, or ulcer.  DATA REVIEW:   CBC  Recent Labs Lab 06/16/16 0327  WBC 11.5*  HGB 12.4  HCT 36.6  PLT 253    Chemistries   Recent Labs Lab 06/15/16 0748 06/16/16 0327  NA 134* 139  K 4.0 3.8  CL 99* 106  CO2 26 28  GLUCOSE 214* 137*  BUN 11 7  CREATININE 1.18* 0.89  CALCIUM 9.5 8.1*  AST 21  --   ALT 23  --   ALKPHOS 38  --   BILITOT 1.3*  --      Microbiology Results  No results found for this or any previous visit.  RADIOLOGY:  No results found.   Management plans discussed with the patient, family and they are in agreement.  CODE STATUS: Full Code   TOTAL TIME TAKING CARE OF THIS PATIENT: 31 minutes.    Shaune Pollackhen, Katalena Malveaux M.D on 06/17/2016 at 1:10 PM  Between 7am to 6pm - Pager - [phone]  After 6pm go to [email or web address] - Social research officer, governmentpassword [employer]AS [facility]  [facility] [place] [facility]  Office  [phone]  CC: Primary care physician; Marisue IvanLinthavong, Kanhka, MD   Note: This dictation was prepared with Dragon dictation along with smaller phrase technology. Any transcriptional errors that result from this process are unintentional.

## 2016-06-17 NOTE — Discharge Instructions (Signed)
° °  Acute Pancreatitis Acute pancreatitis is a condition in which the pancreas suddenly gets irritated and swollen (has inflammation). The pancreas is a large gland behind the stomach. It makes enzymes that help to digest food. The pancreas also makes hormones that help to control your blood sugar. Acute pancreatitis happens when the enzymes attack the pancreas and damage it. Most attacks last a couple of days and can cause serious problems. Follow these instructions at home: Eating and drinking   Follow instructions from your doctor about diet. You may need to:  Avoid alcohol.  Limit how much fat is in your diet.  Eat small meals often. Avoid eating big meals.  Drink enough fluid to keep your pee (urine) clear or pale yellow.  Do not drink alcohol if it caused your condition. General instructions   Take over-the-counter and prescription medicines only as told by your doctor.  Do not use any tobacco products. These include cigarettes, chewing tobacco, and e-cigarettes. If you need help quitting, ask your doctor.  Get plenty of rest.  If directed, check your blood sugar at home as told by your doctor.  Keep all follow-up visits as told by your doctor. This is important. Contact a doctor if:  You do not get better as quickly as expected.  You have new symptoms.  Your symptoms get worse.  You have lasting pain or weakness.  You continue to feel sick to your stomach (nauseous).  You get better and then you have another pain attack.  You have a fever. Get help right away if:  You cannot eat or keep fluids down.  Your pain becomes very bad.  Your skin or the white part of your eyes turns yellow (jaundice).  You throw up (vomit).  You feel dizzy or you pass out (faint).  Your blood sugar is high (over 300 mg/dL). This information is not intended to replace advice given to you by your health care provider. Make sure you discuss any questions you have with your health  care provider. Document Released: 06/27/2007 Document Revised: 06/16/2015 Document Reviewed: 10/12/2014 Elsevier Interactive Patient Education  2017 [employer] Low fat diet. Avoid alcohol.

## 2016-06-20 ENCOUNTER — Encounter: Payer: Self-pay | Admitting: Emergency Medicine

## 2016-06-20 ENCOUNTER — Emergency Department
Admission: EM | Admit: 2016-06-20 | Discharge: 2016-06-20 | Disposition: A | Discharge status: Home / Self Care | Payer: Medicare Other | Source: Home / Self Care

## 2016-06-20 DIAGNOSIS — Z5321 Procedure and treatment not carried out due to patient leaving prior to being seen by health care provider: Secondary | ICD-10-CM | POA: Insufficient documentation

## 2016-06-20 DIAGNOSIS — R1012 Left upper quadrant pain: Secondary | ICD-10-CM | POA: Insufficient documentation

## 2016-06-20 DIAGNOSIS — Z7722 Contact with and (suspected) exposure to environmental tobacco smoke (acute) (chronic): Secondary | ICD-10-CM | POA: Insufficient documentation

## 2016-06-20 DIAGNOSIS — K859 Acute pancreatitis without necrosis or infection, unspecified: Secondary | ICD-10-CM | POA: Diagnosis not present

## 2016-06-20 HISTORY — DX: Acute pancreatitis without necrosis or infection, unspecified: K85.90

## 2016-06-20 LAB — COMPREHENSIVE METABOLIC PANEL
ALBUMIN: 3.6 g/dL (ref 3.5–5.0)
ALK PHOS: 71 U/L (ref 38–126)
ALT: 27 U/L (ref 14–54)
ANION GAP: 12 (ref 5–15)
AST: 22 U/L (ref 15–41)
BUN: 11 mg/dL (ref 6–20)
CHLORIDE: 99 mmol/L — AB (ref 101–111)
CO2: 26 mmol/L (ref 22–32)
Calcium: 9.6 mg/dL (ref 8.9–10.3)
Creatinine, Ser: 0.9 mg/dL (ref 0.44–1.00)
GFR calc non Af Amer: >60 mL/min (ref >60)
GLUCOSE: 124 mg/dL — AB (ref 65–99)
POTASSIUM: 3.8 mmol/L (ref 3.5–5.1)
SODIUM: 137 mmol/L (ref 135–145)
Total Bilirubin: 1 mg/dL (ref 0.3–1.2)
Total Protein: 7.8 g/dL (ref 6.5–8.1)

## 2016-06-20 LAB — CBC
HEMATOCRIT: 43.4 % (ref 35.0–47.0)
HEMOGLOBIN: 15.3 g/dL (ref 12.0–16.0)
MCH: 31 pg (ref 26.0–34.0)
MCHC: 35.3 g/dL (ref 32.0–36.0)
MCV: 87.8 fL (ref 80.0–100.0)
Platelets: 511 10*3/uL — ABNORMAL HIGH (ref 150–440)
RBC: 4.94 MIL/uL (ref 3.80–5.20)
RDW: 13.3 % (ref 11.5–14.5)
WBC: 8.4 10*3/uL (ref 3.6–11.0)

## 2016-06-20 LAB — LIPASE, BLOOD: LIPASE: 57 U/L — AB (ref 11–51)

## 2016-06-20 NOTE — ED Triage Notes (Signed)
Patient presents to the ED with left upper quadrant pain since Friday.  Patient states she was admitted to the hospital at that time and diagnosed with pancreatitis.  Patient reports she was discharged on Sunday.  Patient states, "they filled me up with morphine and sent me home but they didn't give me any medicine to take at home."

## 2016-06-20 NOTE — ED Notes (Signed)
Pt called in lobby, no response 

## 2016-06-20 NOTE — ED Notes (Signed)
Called in lobby with no response.

## 2016-06-22 ENCOUNTER — Inpatient Hospital Stay
Admission: AD | Admit: 2016-06-22 | Discharge: 2016-06-24 | DRG: 439 | Disposition: A | Discharge status: Home / Self Care | Payer: Medicare Other | Source: Ambulatory Visit | Attending: Specialist | Admitting: Specialist

## 2016-06-22 DIAGNOSIS — K859 Acute pancreatitis without necrosis or infection, unspecified: Principal | ICD-10-CM | POA: Diagnosis present

## 2016-06-22 DIAGNOSIS — E785 Hyperlipidemia, unspecified: Secondary | ICD-10-CM | POA: Diagnosis present

## 2016-06-22 DIAGNOSIS — G629 Polyneuropathy, unspecified: Secondary | ICD-10-CM | POA: Diagnosis present

## 2016-06-22 DIAGNOSIS — Z90710 Acquired absence of both cervix and uterus: Secondary | ICD-10-CM | POA: Diagnosis not present

## 2016-06-22 DIAGNOSIS — G710 Muscular dystrophy: Secondary | ICD-10-CM | POA: Diagnosis present

## 2016-06-22 DIAGNOSIS — Z79899 Other long term (current) drug therapy: Secondary | ICD-10-CM

## 2016-06-22 DIAGNOSIS — F329 Major depressive disorder, single episode, unspecified: Secondary | ICD-10-CM | POA: Diagnosis present

## 2016-06-22 DIAGNOSIS — Z7722 Contact with and (suspected) exposure to environmental tobacco smoke (acute) (chronic): Secondary | ICD-10-CM | POA: Diagnosis present

## 2016-06-22 DIAGNOSIS — F419 Anxiety disorder, unspecified: Secondary | ICD-10-CM | POA: Diagnosis present

## 2016-06-22 LAB — PHOSPHORUS: PHOSPHORUS: 2.7 mg/dL (ref 2.5–4.6)

## 2016-06-22 LAB — URINALYSIS, ROUTINE W REFLEX MICROSCOPIC
BILIRUBIN URINE: NEGATIVE
Glucose, UA: NEGATIVE mg/dL
Hgb urine dipstick: NEGATIVE
KETONES UR: 20 mg/dL — AB
LEUKOCYTES UA: NEGATIVE
Nitrite: POSITIVE — AB
PROTEIN: NEGATIVE mg/dL
RBC / HPF: NONE SEEN RBC/hpf (ref 0–5)
Specific Gravity, Urine: 1.023 (ref 1.005–1.030)
pH: 5 (ref 5.0–8.0)

## 2016-06-22 LAB — CBC
HCT: 43.8 % (ref 35.0–47.0)
Hemoglobin: 15 g/dL (ref 12.0–16.0)
MCH: 30.2 pg (ref 26.0–34.0)
MCHC: 34.2 g/dL (ref 32.0–36.0)
MCV: 88.4 fL (ref 80.0–100.0)
PLATELETS: 510 10*3/uL — AB (ref 150–440)
RBC: 4.96 MIL/uL (ref 3.80–5.20)
RDW: 13.6 % (ref 11.5–14.5)
WBC: 8.5 10*3/uL (ref 3.6–11.0)

## 2016-06-22 LAB — COMPREHENSIVE METABOLIC PANEL
ALBUMIN: 3.9 g/dL (ref 3.5–5.0)
ALT: 25 U/L (ref 14–54)
AST: 23 U/L (ref 15–41)
Alkaline Phosphatase: 62 U/L (ref 38–126)
Anion gap: 15 (ref 5–15)
BUN: 13 mg/dL (ref 6–20)
CHLORIDE: 102 mmol/L (ref 101–111)
CO2: 25 mmol/L (ref 22–32)
Calcium: 9.6 mg/dL (ref 8.9–10.3)
Creatinine, Ser: 0.81 mg/dL (ref 0.44–1.00)
GFR calc non Af Amer: >60 mL/min (ref >60)
GLUCOSE: 91 mg/dL (ref 65–99)
Potassium: 3.7 mmol/L (ref 3.5–5.1)
SODIUM: 142 mmol/L (ref 135–145)
Total Bilirubin: 0.9 mg/dL (ref 0.3–1.2)
Total Protein: 7.8 g/dL (ref 6.5–8.1)

## 2016-06-22 LAB — LIPASE, BLOOD: Lipase: 80 U/L — ABNORMAL HIGH (ref 11–51)

## 2016-06-22 LAB — MAGNESIUM: Magnesium: 2.4 mg/dL (ref 1.7–2.4)

## 2016-06-22 MED ORDER — BISACODYL 5 MG PO TBEC
5.0000 mg | DELAYED_RELEASE_TABLET | Freq: Every day | ORAL | Status: DC | PRN
Start: 2016-06-22 — End: 2016-06-24

## 2016-06-22 MED ORDER — SODIUM CHLORIDE 0.9 % IV SOLN
INTRAVENOUS | Status: DC
  Administered 2016-06-22 – 2016-06-24 (×7): via INTRAVENOUS

## 2016-06-22 MED ORDER — MORPHINE SULFATE (PF) 2 MG/ML IV SOLN
2.0000 mg | Freq: Once | INTRAVENOUS | Status: AC
  Administered 2016-06-23: 2 mg via INTRAVENOUS
  Filled 2016-06-22: qty 1

## 2016-06-22 MED ORDER — ZOLPIDEM TARTRATE 5 MG PO TABS
5.0000 mg | ORAL_TABLET | Freq: Every day | ORAL | Status: DC
  Filled 2016-06-22: qty 1

## 2016-06-22 MED ORDER — GABAPENTIN 300 MG PO CAPS
900.0000 mg | ORAL_CAPSULE | Freq: Two times a day (BID) | ORAL | Status: DC
  Filled 2016-06-22: qty 3

## 2016-06-22 MED ORDER — IOPAMIDOL (ISOVUE-300) INJECTION 61%
15.0000 mL | INTRAVENOUS | Status: AC
  Administered 2016-06-22: 15 mL via ORAL

## 2016-06-22 MED ORDER — SENNOSIDES-DOCUSATE SODIUM 8.6-50 MG PO TABS: 1.0000 | ORAL_TABLET | Freq: Every evening | ORAL | Status: DC | PRN

## 2016-06-22 MED ORDER — CYCLOBENZAPRINE HCL 10 MG PO TABS: 15.0000 mg | ORAL_TABLET | Freq: Two times a day (BID) | ORAL | Status: DC | PRN

## 2016-06-22 MED ORDER — ONDANSETRON HCL 4 MG PO TABS: 4.0000 mg | ORAL_TABLET | Freq: Four times a day (QID) | ORAL | Status: DC | PRN

## 2016-06-22 MED ORDER — DULOXETINE HCL 60 MG PO CPEP
60.0000 mg | ORAL_CAPSULE | Freq: Two times a day (BID) | ORAL | Status: DC
  Filled 2016-06-22: qty 1

## 2016-06-22 MED ORDER — FENOFIBRATE 160 MG PO TABS: 160.0000 mg | ORAL_TABLET | Freq: Every day | ORAL | Status: DC

## 2016-06-22 MED ORDER — ACETAMINOPHEN 325 MG PO TABS
650.0000 mg | ORAL_TABLET | Freq: Four times a day (QID) | ORAL | Status: DC | PRN
Start: 2016-06-22 — End: 2016-06-24

## 2016-06-22 MED ORDER — BUSPIRONE HCL 10 MG PO TABS
15.0000 mg | ORAL_TABLET | Freq: Two times a day (BID) | ORAL | Status: DC
  Filled 2016-06-22: qty 2

## 2016-06-22 MED ORDER — IOPAMIDOL (ISOVUE-300) INJECTION 61%: 30.0000 mL | Freq: Once | INTRAVENOUS | Status: DC

## 2016-06-22 MED ORDER — KETOROLAC TROMETHAMINE 30 MG/ML IJ SOLN
30.0000 mg | Freq: Four times a day (QID) | INTRAMUSCULAR | Status: DC | PRN
  Administered 2016-06-22 – 2016-06-24 (×4): 30 mg via INTRAVENOUS
  Filled 2016-06-22 (×4): qty 1

## 2016-06-22 MED ORDER — ONDANSETRON HCL 4 MG/2ML IJ SOLN: 4.0000 mg | Freq: Four times a day (QID) | INTRAMUSCULAR | Status: DC | PRN

## 2016-06-22 MED ORDER — ENOXAPARIN SODIUM 40 MG/0.4ML ~~LOC~~ SOLN
40.0000 mg | SUBCUTANEOUS | Status: DC
  Filled 2016-06-22 (×2): qty 0.4

## 2016-06-22 MED ORDER — HYDROCODONE-ACETAMINOPHEN 5-325 MG PO TABS
1.0000 | ORAL_TABLET | ORAL | Status: DC | PRN
  Filled 2016-06-22: qty 2

## 2016-06-22 MED ORDER — ACETAMINOPHEN 650 MG RE SUPP: 650.0000 mg | Freq: Four times a day (QID) | RECTAL | Status: DC | PRN

## 2016-06-22 NOTE — H&P (Signed)
[facility] - [place] at [facility]   PATIENT NAME: Meredith BucklerSarah Williams    MR#:  [id number]  DATE OF BIRTH:  1980-12-05  DATE OF ADMISSION:  06/22/2016  PRIMARY CARE PHYSICIAN: Marisue IvanLinthavong, Kanhka, MD   REQUESTING/REFERRING PHYSICIAN: dr Burnadette PopLinthavong  CHIEF COMPLAINT:   Abdominal pain HISTORY OF PRESENT ILLNESS:  Meredith Williams  is a 36 y.o. female with a known history of HLD who was recently in the hospital for pancreatitis presented to her PCP office with complaints of abdominal pain, nausea and inabilty to eat due to pain.Her abdominal pain is primarily located RLQ and some epigastric reion without radiationt he back. Her PCP was concerned for recurrent pancreatitis as she has simialr symptoms to most recent hospital stay.  PAST MEDICAL HISTORY:   Past Medical History:  Diagnosis Date  . Hyperlipidemia   . Muscular dystrophy, myotonic (HCC)   . Pancreatitis     PAST SURGICAL HISTORY:   Past Surgical History:  Procedure Laterality Date  . ABDOMINAL HYSTERECTOMY      SOCIAL HISTORY:   Social History  Substance Use Topics  . Smoking status: Passive Smoke Exposure - Never Smoker  . Smokeless tobacco: Never Used  . Alcohol use 2.4 oz/week    4 Shots of liquor per week    FAMILY HISTORY:   Family History  Problem Relation Age of Onset  . Stroke Mother     DRUG ALLERGIES:  No Known Allergies  REVIEW OF SYSTEMS:   Review of Systems  Constitutional: Negative.  Negative for chills, fever and malaise/fatigue.  HENT: Negative.  Negative for ear discharge, ear pain, hearing loss, nosebleeds and sore throat.   Eyes: Negative.  Negative for blurred vision and pain.  Respiratory: Negative.  Negative for cough, hemoptysis, shortness of breath and wheezing.   Cardiovascular: Negative.  Negative for chest pain, palpitations and leg swelling.  Gastrointestinal: Positive for abdominal pain, nausea and vomiting. Negative for blood in stool and diarrhea.  Genitourinary:  Negative.  Negative for dysuria.  Musculoskeletal: Negative.  Negative for back pain.  Skin: Negative.   Neurological: Negative for dizziness, tremors, speech change, focal weakness, seizures and headaches.  Endo/Heme/Allergies: Negative.  Does not bruise/bleed easily.  Psychiatric/Behavioral: Negative.  Negative for depression, hallucinations and suicidal ideas.    MEDICATIONS AT HOME:   Prior to Admission medications   Medication Sig Start Date End Date Taking? Authorizing Provider  busPIRone (BUSPAR) 15 MG tablet Take 15 mg by mouth 2 (two) times daily. 06/06/16   [provider]  cyclobenzaprine (FLEXERIL) 10 MG tablet Take 15 mg by mouth 2 (two) times daily as needed for muscle spasms. 05/29/16   [provider]  DULoxetine (CYMBALTA) 60 MG capsule Take 60 mg by mouth 2 (two) times daily. 04/18/16   [provider]  fenofibrate 160 MG tablet Take 160 mg by mouth daily. 06/05/16   [provider]  gabapentin (NEURONTIN) 300 MG capsule Take 900 mg by mouth 2 (two) times daily.  05/25/16   [provider]  zolpidem (AMBIEN) 5 MG tablet Take 5 mg by mouth at bedtime. 05/21/16   [provider]      VITAL SIGNS:  Blood pressure 119/85, pulse 97, temperature 97.6 F (36.4 C), temperature source Axillary, resp. rate 16, height 5\' 9"  (1.753 m), weight 84.8 kg (186 lb 14.4 oz), SpO2 99 %.  PHYSICAL EXAMINATION:   Physical Exam  Constitutional: She is oriented to person, place, and time and well-developed, well-nourished, and in no  distress. No distress.  HENT:  Head: Normocephalic.  Eyes: No scleral icterus.  Neck: Normal range of motion. Neck supple. No JVD present. No tracheal deviation present.  Cardiovascular: Normal rate, regular rhythm and normal heart sounds.  Exam reveals no gallop and no friction rub.   No murmur heard. Pulmonary/Chest: Effort normal and breath sounds normal. No respiratory distress. She has no wheezes. She has no  rales. She exhibits no tenderness.  Abdominal: Soft. Bowel sounds are normal. She exhibits no distension and no mass. There is tenderness (RLQ). There is no rebound and no guarding.  Musculoskeletal: Normal range of motion. She exhibits no edema.  Neurological: She is alert and oriented to person, place, and time.  Skin: Skin is warm. No rash noted. No erythema.  Psychiatric: Affect and judgment normal.      LABORATORY PANEL:   CBC  Recent Labs Lab 06/20/16 0908  WBC 8.4  HGB 15.3  HCT 43.4  PLT 511*   ------------------------------------------------------------------------------------------------------------------  Chemistries   Recent Labs Lab 06/20/16 0908  NA 137  K 3.8  CL 99*  CO2 26  GLUCOSE 124*  BUN 11  CREATININE 0.90  CALCIUM 9.6  AST 22  ALT 27  ALKPHOS 71  BILITOT 1.0   ------------------------------------------------------------------------------------------------------------------  Cardiac Enzymes No results for input(s): TROPONINI in the last 168 hours. ------------------------------------------------------------------------------------------------------------------  RADIOLOGY:  No results found.  EKG:  none  IMPRESSION AND PLAN:  36 year-old femle discharged from hospital in 5.27 with acute pancreatis diagnosed on CT scan presents again today with similar issues at PCP office.  1. Abdominal pain concerning for recurrent pancreatitis. Last Etoh was several weeks ago. Order CT scan as patient reporting fevers at home. BMP, CBC and lipase ordered. For now NPO. IVF and pain control  2. HLD: Last TG was 285. Continue Fenofibrate      All the records are reviewed and case discussed with Dr Burnadette Pop Management plans discussed with the patient and she is in agreement  CODE STATUS: full  TOTAL TIME TAKING CARE OF THIS PATIENT: 40 minutes.    Aika Brzoska M.D on 06/22/2016 at 5:17 PM  Between 7am to 6pm - Pager -  [phone]  After 6pm go to [email or web address] - Social research officer, government  [facility] [place] [facility]  Office  [phone]  CC: Primary care physician; Marisue Ivan, MD

## 2016-06-22 NOTE — Progress Notes (Signed)
Per Dr. Juliene PinaMody okay for pt to drink contrast even though she is NPO.

## 2016-06-22 NOTE — Progress Notes (Signed)
Notified MD pt continues to drink contrast, slowly, now with pain 9/10. New orders taken. repaged MD pt unable to tolerate po tablets for pain

## 2016-06-23 ENCOUNTER — Inpatient Hospital Stay: Payer: Medicare Other

## 2016-06-23 ENCOUNTER — Encounter: Payer: Self-pay | Admitting: Radiology

## 2016-06-23 LAB — BASIC METABOLIC PANEL
Anion gap: 11 (ref 5–15)
BUN: 10 mg/dL (ref 6–20)
CALCIUM: 8.5 mg/dL — AB (ref 8.9–10.3)
CO2: 23 mmol/L (ref 22–32)
CREATININE: 0.89 mg/dL (ref 0.44–1.00)
Chloride: 104 mmol/L (ref 101–111)
GFR calc Af Amer: >60 mL/min (ref >60)
GLUCOSE: 100 mg/dL — AB (ref 65–99)
Potassium: 3.8 mmol/L (ref 3.5–5.1)
SODIUM: 138 mmol/L (ref 135–145)

## 2016-06-23 LAB — CBC
HCT: 38.8 % (ref 35.0–47.0)
Hemoglobin: 13.4 g/dL (ref 12.0–16.0)
MCH: 30.7 pg (ref 26.0–34.0)
MCHC: 34.6 g/dL (ref 32.0–36.0)
MCV: 88.7 fL (ref 80.0–100.0)
PLATELETS: 423 10*3/uL (ref 150–440)
RBC: 4.38 MIL/uL (ref 3.80–5.20)
RDW: 13.7 % (ref 11.5–14.5)
WBC: 7.1 10*3/uL (ref 3.6–11.0)

## 2016-06-23 MED ORDER — IOPAMIDOL (ISOVUE-300) INJECTION 61%
100.0000 mL | Freq: Once | INTRAVENOUS | Status: AC | PRN
  Administered 2016-06-23: 100 mL via INTRAVENOUS

## 2016-06-23 NOTE — Progress Notes (Signed)
[facility] - [place] at [facility]   PATIENT NAME: Meredith Williams    MR#:  [id number]  DATE OF BIRTH:  23-Aug-1980  SUBJECTIVE:   Patient here due to abdominal pain noted to have acute pancreatitis. Lipase is normal. Having some nausea but no vomiting. Still having some left upper quadrant abdominal pain.  REVIEW OF SYSTEMS:    Review of Systems  Constitutional: Negative for chills and fever.  HENT: Negative for congestion and tinnitus.   Eyes: Negative for blurred vision and double vision.  Respiratory: Negative for cough, shortness of breath and wheezing.   Cardiovascular: Negative for chest pain, orthopnea and PND.  Gastrointestinal: Positive for abdominal pain and nausea. Negative for diarrhea and vomiting.  Genitourinary: Negative for dysuria and hematuria.  Neurological: Negative for dizziness, sensory change and focal weakness.  All other systems reviewed and are negative.   Nutrition: Clear liquids Tolerating Diet: Yes Tolerating PT: Ambulatory   DRUG ALLERGIES:   Allergies  Allergen Reactions  . Tape Itching    Causes itching & burning if goes into muscle    VITALS:  Blood pressure 107/73, pulse 100, temperature 97.8 F (36.6 C), temperature source Oral, resp. rate 20, height 5\' 9"  (1.753 m), weight 84.8 kg (186 lb 14.4 oz), SpO2 94 %.  PHYSICAL EXAMINATION:   Physical Exam  GENERAL:  36 y.o.-year-old patient lying in bed in no acute distress.  EYES: Pupils equal, round, reactive to light and accommodation. No scleral icterus. Extraocular muscles intact.  HEENT: Head atraumatic, normocephalic. Oropharynx and nasopharynx clear.  NECK:  Supple, no jugular venous distention. No thyroid enlargement, no tenderness.  LUNGS: Normal breath sounds bilaterally, no wheezing, rales, rhonchi. No use of accessory muscles of respiration.  CARDIOVASCULAR: S1, S2 normal. No murmurs, rubs, or gallops.  ABDOMEN: Soft, Tender in LUQ but no rebound, rigidity,  nondistended. Bowel sounds present. No organomegaly or mass.  EXTREMITIES: No cyanosis, clubbing or edema b/l.    NEUROLOGIC: Cranial nerves II through XII are intact. No focal Motor or sensory deficits b/l.   PSYCHIATRIC: The patient is alert and oriented x 3.  SKIN: No obvious rash, lesion, or ulcer.    LABORATORY PANEL:   CBC  Recent Labs Lab 06/23/16 0531  WBC 7.1  HGB 13.4  HCT 38.8  PLT 423   ------------------------------------------------------------------------------------------------------------------  Chemistries   Recent Labs Lab 06/22/16 2130 06/23/16 0531  NA 142 138  K 3.7 3.8  CL 102 104  CO2 25 23  GLUCOSE 91 100*  BUN 13 10  CREATININE 0.81 0.89  CALCIUM 9.6 8.5*  MG 2.4  --   AST 23  --   ALT 25  --   ALKPHOS 62  --   BILITOT 0.9  --    ------------------------------------------------------------------------------------------------------------------  Cardiac Enzymes No results for input(s): TROPONINI in the last 168 hours. ------------------------------------------------------------------------------------------------------------------  RADIOLOGY:  Ct Abdomen W Contrast  Result Date: 06/23/2016 CLINICAL DATA:  Abdominal pain with nausea and inability eat. Recent episode of pancreatitis. EXAM: CT ABDOMEN WITH CONTRAST TECHNIQUE: Multidetector CT imaging of the abdomen was performed using the standard protocol following bolus administration of intravenous contrast. CONTRAST:  ISOVUE-300 IOPAMIDOL (ISOVUE-300) INJECTION 61% COMPARISON:  06/15/2016 FINDINGS: Lower chest:  Subsegmental atelectasis noted lower lobes. Hepatobiliary: No focal abnormality within the liver parenchyma. There is no evidence for gallstones, gallbladder wall thickening, or pericholecystic fluid. No intrahepatic or extrahepatic biliary dilation. Pancreas: The peripancreatic edema/inflammation seen on the previous study has decreased in the interval. Pancreatic  parenchyma  enhances throughout. No dilatation of the main pancreatic duct. Spleen: No splenomegaly. No focal mass lesion. Adrenals/Urinary Tract: No adrenal nodule or mass. Right kidney unremarkable. Left kidney atrophic. No evidence for hydroureter. The urinary bladder appears normal for the degree of distention. Stomach/Bowel: Stomach is nondistended. No gastric wall thickening. No evidence of outlet obstruction. Duodenum is normally positioned as is the ligament of Treitz. No small bowel wall thickening. No small bowel dilatation. The terminal ileum is normal. The appendix is normal. No gross colonic mass. No colonic wall thickening. No substantial diverticular change. Vascular/Lymphatic: No abdominal aortic aneurysm. No abdominal aortic atherosclerotic calcification. There is no gastrohepatic or hepatoduodenal ligament lymphadenopathy. No intraperitoneal or retroperitoneal lymphadenopathy. No pelvic sidewall lymphadenopathy. Reproductive: Uterus surgically absent.  There is no adnexal mass. Other: No intraperitoneal free fluid. Musculoskeletal: Bone windows reveal no worrisome lytic or sclerotic osseous lesions. IMPRESSION: 1. Interval decrease and near resolution of peripancreatic edema/inflammation. Pancreatic parenchyma enhances throughout on today's study. No dilatation of the main pancreatic duct. No abscess or pseudocyst. Electronically Signed   By: Eric  Mansell M.D.   On: 06/23/2016 09:46     ASSESSMENT AND PLAN:   [age] Kenn[age] old female with past medical history of alcohol abuse, recent admission for acute pancreatitis, hyperlipidemia, history of muscular dystrophy who presented to the hospital due to abdominal pain and nausea.  1. Acute pancreatitis-this is a cause of patient's abdominal pain and nausea. CT scan of abdomen pelvis on this admission showing decrease in the pancreatic inflammation edema from her previous CT last week. -Continue supportive care with pain control, IV fluids, antibiotics. -We'll  start on clear liquid diet today.  2. Anxiety/depression-continue BuSpar, Cymbalta  3. Hyperlipidemia- cont. Fenofibrate  4. Neuropathy - cont. Gabapentin.    All the records are reviewed and case discussed with Care Management/Social Worker. Management plans discussed with the patient, family and they are in agreement.  CODE STATUS: Full code  DVT Prophylaxis: Lovenox  TOTAL TIME TAKING CARE OF THIS PATIENT: 30 minutes.   POSSIBLE D/C IN 1-2 DAYS, DEPENDING ON CLINICAL CONDITION.   [place] SirenSAINANI,Missy Baksh J M.D on 06/23/2016 at 1:53 PM  Between 7am to 6pm - Pager - [phone]  After 6pm go to [email or web address] - Social research officer, governmentpassword [email or web address]  [facility] [place] [facility]  Office  [phone]  CC: Primary care physician; Marisue IvanLinthavong, Kanhka, MD

## 2016-06-24 MED ORDER — ONDANSETRON HCL 4 MG PO TABS: 4.0000 mg | ORAL_TABLET | Freq: Four times a day (QID) | ORAL | 0 refills | Status: DC | PRN

## 2016-06-24 NOTE — Progress Notes (Signed)
[facility] - [place] at [facility]   PATIENT NAME: Meredith BucklerSarah Williams    MR#:  [id number]  DATE OF BIRTH:  1980/08/31  SUBJECTIVE:   Patient here due to abdominal pain noted to have acute pancreatitis. Abdominal pain improved.  Tolerating clear liquids well. No nausea or vomiting presently.  REVIEW OF SYSTEMS:    Review of Systems  Constitutional: Negative for chills and fever.  HENT: Negative for congestion and tinnitus.   Eyes: Negative for blurred vision and double vision.  Respiratory: Negative for cough, shortness of breath and wheezing.   Cardiovascular: Negative for chest pain, orthopnea and PND.  Gastrointestinal: Positive for abdominal pain. Negative for diarrhea, nausea and vomiting.  Genitourinary: Negative for dysuria and hematuria.  Neurological: Negative for dizziness, sensory change and focal weakness.  All other systems reviewed and are negative.   Nutrition: Clear liquids Tolerating Diet: Yes Tolerating PT: Ambulatory   DRUG ALLERGIES:   Allergies  Allergen Reactions  . Tape Itching    Causes itching & burning if goes into muscle    VITALS:  Blood pressure (!) 133/92, pulse 82, temperature 98 F (36.7 C), resp. rate 18, height 5\' 9"  (1.753 m), weight 84.8 kg (186 lb 14.4 oz), SpO2 99 %.  PHYSICAL EXAMINATION:   Physical Exam  GENERAL:  36 y.o.-year-old patient lying in bed in no acute distress.  EYES: Pupils equal, round, reactive to light and accommodation. No scleral icterus. Extraocular muscles intact.  HEENT: Head atraumatic, normocephalic. Oropharynx and nasopharynx clear.  NECK:  Supple, no jugular venous distention. No thyroid enlargement, no tenderness.  LUNGS: Normal breath sounds bilaterally, no wheezing, rales, rhonchi. No use of accessory muscles of respiration.  CARDIOVASCULAR: S1, S2 normal. No murmurs, rubs, or gallops.  ABDOMEN: Soft, Non-tender, nondistended. Bowel sounds present. No organomegaly or mass.  EXTREMITIES: No  cyanosis, clubbing or edema b/l.    NEUROLOGIC: Cranial nerves II through XII are intact. No focal Motor or sensory deficits b/l.   PSYCHIATRIC: The patient is alert and oriented x 3.  SKIN: No obvious rash, lesion, or ulcer.    LABORATORY PANEL:   CBC  Recent Labs Lab 06/23/16 0531  WBC 7.1  HGB 13.4  HCT 38.8  PLT 423   ------------------------------------------------------------------------------------------------------------------  Chemistries   Recent Labs Lab 06/22/16 2130 06/23/16 0531  NA 142 138  K 3.7 3.8  CL 102 104  CO2 25 23  GLUCOSE 91 100*  BUN 13 10  CREATININE 0.81 0.89  CALCIUM 9.6 8.5*  MG 2.4  --   AST 23  --   ALT 25  --   ALKPHOS 62  --   BILITOT 0.9  --    ------------------------------------------------------------------------------------------------------------------  Cardiac Enzymes No results for input(s): TROPONINI in the last 168 hours. ------------------------------------------------------------------------------------------------------------------  RADIOLOGY:  Ct Abdomen W Contrast  Result Date: 06/23/2016 CLINICAL DATA:  Abdominal pain with nausea and inability eat. Recent episode of pancreatitis. EXAM: CT ABDOMEN WITH CONTRAST TECHNIQUE: Multidetector CT imaging of the abdomen was performed using the standard protocol following bolus administration of intravenous contrast. CONTRAST:  100mL ISOVUE-300 IOPAMIDOL (ISOVUE-300) INJECTION 61% COMPARISON:  06/15/2016 FINDINGS: Lower chest:  Subsegmental atelectasis noted lower lobes. Hepatobiliary: No focal abnormality within the liver parenchyma. There is no evidence for gallstones, gallbladder wall thickening, or pericholecystic fluid. No intrahepatic or extrahepatic biliary dilation. Pancreas: The peripancreatic edema/inflammation seen on the previous study has decreased in the interval. Pancreatic parenchyma enhances throughout. No dilatation of the main pancreatic duct. Spleen: No  splenomegaly. No focal mass lesion. Adrenals/Urinary Tract: No adrenal nodule or mass. Right kidney unremarkable. Left kidney atrophic. No evidence for hydroureter. The urinary bladder appears normal for the degree of distention. Stomach/Bowel: Stomach is nondistended. No gastric wall thickening. No evidence of outlet obstruction. Duodenum is normally positioned as is the ligament of Treitz. No small bowel wall thickening. No small bowel dilatation. The terminal ileum is normal. The appendix is normal. No gross colonic mass. No colonic wall thickening. No substantial diverticular change. Vascular/Lymphatic: No abdominal aortic aneurysm. No abdominal aortic atherosclerotic calcification. There is no gastrohepatic or hepatoduodenal ligament lymphadenopathy. No intraperitoneal or retroperitoneal lymphadenopathy. No pelvic sidewall lymphadenopathy. Reproductive: Uterus surgically absent.  There is no adnexal mass. Other: No intraperitoneal free fluid. Musculoskeletal: Bone windows reveal no worrisome lytic or sclerotic osseous lesions. IMPRESSION: 1. Interval decrease and near resolution of peripancreatic edema/inflammation. Pancreatic parenchyma enhances throughout on today's study. No dilatation of the main pancreatic duct. No abscess or pseudocyst. Electronically Signed   By: Kennith Center M.D.   On: 06/23/2016 09:46     ASSESSMENT AND PLAN:   36 year old female with past medical history of alcohol abuse, recent admission for acute pancreatitis, hyperlipidemia, history of muscular dystrophy who presented to the hospital due to abdominal pain and nausea.  1. Acute pancreatitis-this is the cause of patient's abdominal pain and nausea. CT scan of abdomen pelvis on this admission showed decrease in the pancreatic inflammation edema from her previous CT last week. -Continue supportive care with pain control, IV fluids, anti-emetics.  - tolerating clear liquids and will advance to low-fat diet and monitor.   2.  Anxiety/depression-continue BuSpar, Cymbalta  3. Hyperlipidemia- cont. Fenofibrate  4. Neuropathy - cont. Gabapentin.   Possible d/c home later today if tolerating PO well.   All the records are reviewed and case discussed with Care Management/Social Worker. Management plans discussed with the patient, family and they are in agreement.  CODE STATUS: Full code  DVT Prophylaxis: Lovenox  TOTAL TIME TAKING CARE OF THIS PATIENT: 25 minutes.   POSSIBLE D/C IN 1-2 DAYS, DEPENDING ON CLINICAL CONDITION.   [place] Siren M.D on 06/24/2016 at 12:50 PM  Between 7am to 6pm - Pager - [phone]  After 6pm go to [email or web address] - Social research officer, government  [facility] [place] [facility]  Office  [phone]  CC: Primary care physician; Marisue Ivan, MD

## 2016-06-24 NOTE — Progress Notes (Signed)
Pt A and O x 4. VSS. Pt tolerating diet well. No complaints of pain or nausea. IV removed intact, prescriptions given. Pt voiced understanding of discharge instructions with no further questions. Pt discharged via wheelchair with axillary.   

## 2016-06-25 NOTE — Discharge Summary (Signed)
[facility] - [place] at [facility]   PATIENT NAME: Meredith Williams    MR#:  [id number]  DATE OF BIRTH:  1980/02/21  DATE OF ADMISSION:  06/22/2016 ADMITTING PHYSICIAN: Adrian Saran, MD  DATE OF DISCHARGE: 06/24/2016  5:44 PM  PRIMARY CARE PHYSICIAN: Marisue Ivan, MD    ADMISSION DIAGNOSIS:  acute recurent pancreatitis  DISCHARGE DIAGNOSIS:  Active Problems:   Pancreatitis   SECONDARY DIAGNOSIS:   Past Medical History:  Diagnosis Date  . Hyperlipidemia   . Muscular dystrophy, myotonic (HCC)   . Pancreatitis     HOSPITAL COURSE:   36 year old female with past medical history of alcohol abuse, recent admission for acute pancreatitis, hyperlipidemia, history of muscular dystrophy who presented to the hospital due to abdominal pain and nausea.  1. Acute pancreatitis-this was the cause of patient's abdominal pain and nausea. CT scan of abdomen pelvis on this admission showed decrease in the pancreatic inflammation edema from her previous CT last week. -Patient was treated supportively with IV fluids, antibiotics and pain control. She did improve with supportive therapy. Her diet was slowly advanced from a clear liquid eventually to a low-fat diet which she is tolerating without any first thing abdominal pain and nausea vomiting. She is being discharged home and is strongly been advised to abstain from alcohol and follow a low fat diet  2. Anxiety/depression- she will continue BuSpar, Cymbalta  3. Hyperlipidemia- she will cont. Fenofibrate  4. Neuropathy - she will cont. Gabapentin.   DISCHARGE CONDITIONS:   Stable.   CONSULTS OBTAINED:    DRUG ALLERGIES:   Allergies  Allergen Reactions  . Tape Itching    Causes itching & burning if goes into muscle    DISCHARGE MEDICATIONS:   Allergies as of 06/24/2016      Reactions   Tape Itching   Causes itching & burning if goes into muscle      Medication List    TAKE these medications   busPIRone 15  MG tablet Commonly known as:  BUSPAR Take 15 mg by mouth 2 (two) times daily.   cyclobenzaprine 10 MG tablet Commonly known as:  FLEXERIL Take 15 mg by mouth 2 (two) times daily as needed for muscle spasms.   DULoxetine 60 MG capsule Commonly known as:  CYMBALTA Take 60 mg by mouth 2 (two) times daily.   fenofibrate 160 MG tablet Take 160 mg by mouth daily.   gabapentin 300 MG capsule Commonly known as:  NEURONTIN Take 900 mg by mouth 2 (two) times daily.   ondansetron 4 MG tablet Commonly known as:  ZOFRAN Take 1 tablet (4 mg total) by mouth every 6 (six) hours as needed for nausea.   zolpidem 5 MG tablet Commonly known as:  AMBIEN Take 5 mg by mouth at bedtime.         DISCHARGE INSTRUCTIONS:   DIET:  Low fat, Low cholesterol diet  DISCHARGE CONDITION:  Stable  ACTIVITY:  Activity as tolerated  OXYGEN:  Home Oxygen: No.   Oxygen Delivery: room air  DISCHARGE LOCATION:  home   If you experience worsening of your admission symptoms, develop shortness of breath, life threatening emergency, suicidal or homicidal thoughts you must seek medical attention immediately by calling 911 or calling your MD immediately  if symptoms less severe.  You Must read complete instructions/literature along with all the possible adverse reactions/side effects for all the Medicines you take and that have been prescribed to you. Take any new Medicines after you have  completely understood and accpet all the possible adverse reactions/side effects.   Please note  You were cared for by a hospitalist during your hospital stay. If you have any questions about your discharge medications or the care you received while you were in the hospital after you are discharged, you can call the unit and asked to speak with the hospitalist on call if the hospitalist that took care of you is not available. Once you are discharged, your primary care physician will handle any further medical issues.  Please note that NO REFILLS for any discharge medications will be authorized once you are discharged, as it is imperative that you return to your primary care physician (or establish a relationship with a primary care physician if you do not have one) for your aftercare needs so that they can reassess your need for medications and monitor your lab values.   DATA REVIEW:   CBC  Recent Labs Lab 06/23/16 0531  WBC 7.1  HGB 13.4  HCT 38.8  PLT 423    Chemistries   Recent Labs Lab 06/22/16 2130 06/23/16 0531  NA 142 138  K 3.7 3.8  CL 102 104  CO2 25 23  GLUCOSE 91 100*  BUN 13 10  CREATININE 0.81 0.89  CALCIUM 9.6 8.5*  MG 2.4  --   AST 23  --   ALT 25  --   ALKPHOS 62  --   BILITOT 0.9  --     Cardiac Enzymes No results for input(s): TROPONINI in the last 168 hours.  Microbiology Results  No results found for this or any previous visit.  RADIOLOGY:  No results found.    Management plans discussed with the patient, family and they are in agreement.  CODE STATUS:  Code Status History    Date Active Date Inactive Code Status Order ID Comments User Context   06/22/2016  4:43 PM 06/24/2016  8:49 PM Full Code [id number]  Adrian SaranMody, Sital, MD Inpatient   06/15/2016  3:28 PM 06/17/2016  2:52 PM Full Code [id number]  Altamese DillingVachhani, Vaibhavkumar, MD Inpatient      TOTAL TIME TAKING CARE OF THIS PATIENT: 40 minutes.    [place] SirenSAINANI,Tysean [place]li[place] J M.D on 06/25/2016 at 3:46 PM  Between 7am to 6pm - Pager - [phone]  After 6pm go to [email or web address] - Social research officer, governmentpassword EPAS [facility]  [facility] [place] [facility]  Office  [phone]  CC: Primary care physician; Marisue IvanLinthavong, Kanhka, MD

## 2016-09-13 DIAGNOSIS — E119 Type 2 diabetes mellitus without complications: Secondary | ICD-10-CM | POA: Insufficient documentation

## 2016-09-19 ENCOUNTER — Ambulatory Visit
Payer: Medicare Other | Attending: Student in an Organized Health Care Education/Training Program | Admitting: Student in an Organized Health Care Education/Training Program

## 2016-09-19 ENCOUNTER — Encounter: Payer: Self-pay | Admitting: Student in an Organized Health Care Education/Training Program

## 2016-09-19 VITALS — BP 116/69 | HR 104 | Temp 98.5°F | Resp 18 | Ht 66.0 in | Wt 199.0 lb

## 2016-09-19 DIAGNOSIS — M545 Low back pain: Secondary | ICD-10-CM | POA: Diagnosis not present

## 2016-09-19 DIAGNOSIS — G894 Chronic pain syndrome: Secondary | ICD-10-CM | POA: Diagnosis present

## 2016-09-19 DIAGNOSIS — Z90710 Acquired absence of both cervix and uterus: Secondary | ICD-10-CM | POA: Diagnosis not present

## 2016-09-19 DIAGNOSIS — M791 Myalgia, unspecified site: Secondary | ICD-10-CM

## 2016-09-19 DIAGNOSIS — F1411 Cocaine abuse, in remission: Secondary | ICD-10-CM

## 2016-09-19 DIAGNOSIS — G7100 Muscular dystrophy, unspecified: Secondary | ICD-10-CM

## 2016-09-19 DIAGNOSIS — Z87898 Personal history of other specified conditions: Secondary | ICD-10-CM | POA: Diagnosis not present

## 2016-09-19 DIAGNOSIS — K859 Acute pancreatitis without necrosis or infection, unspecified: Secondary | ICD-10-CM | POA: Diagnosis not present

## 2016-09-19 DIAGNOSIS — G8929 Other chronic pain: Secondary | ICD-10-CM

## 2016-09-19 MED ORDER — TOPIRAMATE 25 MG PO CPSP: 25.0000 mg | ORAL_CAPSULE | Freq: Two times a day (BID) | ORAL | 1 refills | Status: DC

## 2016-09-19 NOTE — Patient Instructions (Signed)
Topmax 25 mg twice daily, follow up in 6 weeks
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
## 2016-09-19 NOTE — Progress Notes (Signed)
Safety precautions to be maintained throughout the outpatient stay will include: orient to surroundings, keep bed in low position, maintain call bell within reach at all times, provide assistance with transfer out of bed and ambulation.  

## 2016-09-27 DIAGNOSIS — E119 Type 2 diabetes mellitus without complications: Secondary | ICD-10-CM | POA: Diagnosis not present

## 2016-09-27 DIAGNOSIS — H2513 Age-related nuclear cataract, bilateral: Secondary | ICD-10-CM | POA: Diagnosis not present

## 2016-09-27 DIAGNOSIS — H43392 Other vitreous opacities, left eye: Secondary | ICD-10-CM | POA: Diagnosis not present

## 2016-09-27 DIAGNOSIS — E089 Diabetes mellitus due to underlying condition without complications: Secondary | ICD-10-CM | POA: Diagnosis not present

## 2016-09-29 DIAGNOSIS — Z0100 Encounter for examination of eyes and vision without abnormal findings: Secondary | ICD-10-CM | POA: Diagnosis not present

## 2016-10-29 DIAGNOSIS — F331 Major depressive disorder, recurrent, moderate: Secondary | ICD-10-CM | POA: Diagnosis not present

## 2016-10-30 ENCOUNTER — Encounter: Payer: Self-pay | Admitting: Student in an Organized Health Care Education/Training Program

## 2016-10-30 ENCOUNTER — Ambulatory Visit
Payer: Medicare HMO | Attending: Student in an Organized Health Care Education/Training Program | Admitting: Student in an Organized Health Care Education/Training Program

## 2016-10-30 VITALS — BP 105/79 | HR 95 | Temp 98.3°F | Resp 16 | Ht 66.0 in | Wt 200.0 lb

## 2016-10-30 DIAGNOSIS — F419 Anxiety disorder, unspecified: Secondary | ICD-10-CM | POA: Insufficient documentation

## 2016-10-30 DIAGNOSIS — Z87898 Personal history of other specified conditions: Secondary | ICD-10-CM | POA: Diagnosis not present

## 2016-10-30 DIAGNOSIS — G894 Chronic pain syndrome: Secondary | ICD-10-CM | POA: Diagnosis not present

## 2016-10-30 DIAGNOSIS — M545 Low back pain, unspecified: Secondary | ICD-10-CM

## 2016-10-30 DIAGNOSIS — Z7982 Long term (current) use of aspirin: Secondary | ICD-10-CM | POA: Diagnosis not present

## 2016-10-30 DIAGNOSIS — Z7984 Long term (current) use of oral hypoglycemic drugs: Secondary | ICD-10-CM | POA: Insufficient documentation

## 2016-10-30 DIAGNOSIS — F1410 Cocaine abuse, uncomplicated: Secondary | ICD-10-CM | POA: Insufficient documentation

## 2016-10-30 DIAGNOSIS — F329 Major depressive disorder, single episode, unspecified: Secondary | ICD-10-CM | POA: Insufficient documentation

## 2016-10-30 DIAGNOSIS — G7100 Muscular dystrophy, unspecified: Secondary | ICD-10-CM | POA: Diagnosis not present

## 2016-10-30 DIAGNOSIS — M25751 Osteophyte, right hip: Secondary | ICD-10-CM | POA: Insufficient documentation

## 2016-10-30 DIAGNOSIS — M79604 Pain in right leg: Secondary | ICD-10-CM | POA: Diagnosis present

## 2016-10-30 DIAGNOSIS — E785 Hyperlipidemia, unspecified: Secondary | ICD-10-CM | POA: Diagnosis not present

## 2016-10-30 DIAGNOSIS — E119 Type 2 diabetes mellitus without complications: Secondary | ICD-10-CM | POA: Diagnosis not present

## 2016-10-30 DIAGNOSIS — Z79899 Other long term (current) drug therapy: Secondary | ICD-10-CM | POA: Insufficient documentation

## 2016-10-30 DIAGNOSIS — G8929 Other chronic pain: Secondary | ICD-10-CM | POA: Diagnosis not present

## 2016-10-30 DIAGNOSIS — M79605 Pain in left leg: Secondary | ICD-10-CM | POA: Diagnosis present

## 2016-10-30 DIAGNOSIS — F1411 Cocaine abuse, in remission: Secondary | ICD-10-CM

## 2016-10-30 DIAGNOSIS — M791 Myalgia, unspecified site: Secondary | ICD-10-CM | POA: Diagnosis not present

## 2016-10-30 MED ORDER — TOPIRAMATE 50 MG PO TABS: 50.0000 mg | ORAL_TABLET | Freq: Two times a day (BID) | ORAL | 2 refills | Status: DC

## 2016-10-30 NOTE — Progress Notes (Signed)
Safety precautions to be maintained throughout the outpatient stay will include: orient to surroundings, keep bed in low position, maintain call bell within reach at all times, provide assistance with transfer out of bed and ambulation.  

## 2016-10-30 NOTE — Patient Instructions (Signed)
1. Increase Topamax to 50 mg twice daily 2. Follow up in 6-8 weeks
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
## 2016-11-01 DIAGNOSIS — F331 Major depressive disorder, recurrent, moderate: Secondary | ICD-10-CM | POA: Diagnosis not present

## 2016-11-10 ENCOUNTER — Other Ambulatory Visit: Payer: Self-pay | Admitting: Student in an Organized Health Care Education/Training Program

## 2016-11-13 ENCOUNTER — Emergency Department: Payer: Medicare HMO

## 2016-11-13 ENCOUNTER — Encounter: Payer: Self-pay | Admitting: *Deleted

## 2016-11-13 ENCOUNTER — Emergency Department
Admission: EM | Admit: 2016-11-13 | Discharge: 2016-11-13 | Disposition: A | Discharge status: Home / Self Care | Payer: Medicare HMO | Attending: Emergency Medicine | Admitting: Emergency Medicine

## 2016-11-13 DIAGNOSIS — J9811 Atelectasis: Secondary | ICD-10-CM | POA: Diagnosis not present

## 2016-11-13 DIAGNOSIS — Z79899 Other long term (current) drug therapy: Secondary | ICD-10-CM | POA: Diagnosis not present

## 2016-11-13 DIAGNOSIS — Z7984 Long term (current) use of oral hypoglycemic drugs: Secondary | ICD-10-CM | POA: Insufficient documentation

## 2016-11-13 DIAGNOSIS — R1013 Epigastric pain: Secondary | ICD-10-CM | POA: Diagnosis not present

## 2016-11-13 DIAGNOSIS — R1010 Upper abdominal pain, unspecified: Secondary | ICD-10-CM

## 2016-11-13 DIAGNOSIS — Z7722 Contact with and (suspected) exposure to environmental tobacco smoke (acute) (chronic): Secondary | ICD-10-CM | POA: Insufficient documentation

## 2016-11-13 DIAGNOSIS — E119 Type 2 diabetes mellitus without complications: Secondary | ICD-10-CM | POA: Insufficient documentation

## 2016-11-13 DIAGNOSIS — Z7982 Long term (current) use of aspirin: Secondary | ICD-10-CM | POA: Insufficient documentation

## 2016-11-13 LAB — COMPREHENSIVE METABOLIC PANEL
ALK PHOS: 38 U/L (ref 38–126)
ALT: 31 U/L (ref 14–54)
ANION GAP: 13 (ref 5–15)
AST: 25 U/L (ref 15–41)
Albumin: 4.4 g/dL (ref 3.5–5.0)
BUN: 17 mg/dL (ref 6–20)
CALCIUM: 9.6 mg/dL (ref 8.9–10.3)
CO2: 24 mmol/L (ref 22–32)
Chloride: 99 mmol/L — ABNORMAL LOW (ref 101–111)
Creatinine, Ser: 1.06 mg/dL — ABNORMAL HIGH (ref 0.44–1.00)
GFR calc non Af Amer: >60 mL/min (ref >60)
Glucose, Bld: 177 mg/dL — ABNORMAL HIGH (ref 65–99)
Potassium: 5.2 mmol/L — ABNORMAL HIGH (ref 3.5–5.1)
SODIUM: 136 mmol/L (ref 135–145)
TOTAL PROTEIN: 7.7 g/dL (ref 6.5–8.1)
Total Bilirubin: 0.8 mg/dL (ref 0.3–1.2)

## 2016-11-13 LAB — CBC
HCT: 48.9 % — ABNORMAL HIGH (ref 35.0–47.0)
HEMOGLOBIN: 16.3 g/dL — AB (ref 12.0–16.0)
MCH: 29.7 pg (ref 26.0–34.0)
MCHC: 33.3 g/dL (ref 32.0–36.0)
MCV: 89.2 fL (ref 80.0–100.0)
Platelets: 382 10*3/uL (ref 150–440)
RBC: 5.48 MIL/uL — AB (ref 3.80–5.20)
RDW: 12.5 % (ref 11.5–14.5)
WBC: 11.6 10*3/uL — AB (ref 3.6–11.0)

## 2016-11-13 LAB — LIPASE, BLOOD: Lipase: 25 U/L (ref 11–51)

## 2016-11-13 MED ORDER — DICYCLOMINE HCL 10 MG PO CAPS: 10.0000 mg | ORAL_CAPSULE | Freq: Three times a day (TID) | ORAL | 0 refills | Status: DC | PRN

## 2016-11-13 NOTE — Discharge Instructions (Signed)

## 2016-11-13 NOTE — ED Provider Notes (Signed)
[facility] Emergency Department Provider Note  ____________________________________________   First MD Initiated Contact with Patient 11/13/16 1214     (approximate)  I have reviewed the triage vital signs and the nursing notes.   HISTORY  Chief Complaint Abdominal Pain    HPI Meredith Williams is a 36 y.o. female With several recent emergency department visits for acute pancreatitis who also has a history of substance abuse including alcohol and opioids who presents by private vehicle for evaluation of persistent upper abdominal and back pain for the last 5 days.  She states it does not feel the same as prior episodes of pancreatitis but cannot explain exactly how it is different.  As it is more on the left side of her body than the right but is also in the middle of her upper abdomen and radiates through.  The pain is intermittent and nothing in particular makes it better nor worse.  She had one episode of vomiting 2 days ago and has had some mild nausea.  It is unclear if the pain is worse today, but she states that she is tired of being uncomfortable particularly when she tries to sleep at night.  She has been seen recently at the pain clinic and started on a non-opioid regimen that includes gabapentin and Topamax; she has chronic pain syndrome thought to be due to muscular dystrophy.    she denies any recent alcohol use and states that the last time she had anything to drink was 2 weeks prior to her last episode of pancreatitis which was 5 months ago.  she denies fever/chills, chest pain, shortness of breath, cough, diarrhea, and she does report chronic constipation that is no different than baseline.   Past Medical History:  Diagnosis Date  . Diabetes mellitus without complication ([place])   . Hyperlipidemia   . 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 Pancreatitis     Patient Active Problem List   Diagnosis Date Noted  . Pancreatitis 06/22/2016  . Acute  pancreatitis 06/15/2016    Past Surgical History:  Procedure Laterality Date  . ABDOMINAL HYSTERECTOMY    . HIP SURGERY Right 2016   "removal of a bone growth"    Prior to Admission medications   Medication Sig Start Date End Date Taking? Authorizing Provider  aspirin EC 81 MG tablet Take 81 mg by mouth daily.   Yes [provider]  busPIRone (BUSPAR) 15 MG tablet Take 15 mg by mouth 2 (two) times daily. 06/06/16  Yes [provider]  cyclobenzaprine (FLEXERIL) 10 MG tablet Take 15 mg by mouth 2 (two) times daily as needed for muscle spasms. 05/29/16  Yes [provider]  fenofibrate 160 MG tablet Take 160 mg by mouth daily. 06/05/16  Yes [provider]  gabapentin (NEURONTIN) 300 MG capsule Take 900 mg by mouth 2 (two) times daily.  05/25/16  Yes [provider]  metFORMIN (GLUCOPHAGE) 500 MG tablet Take 500 mg by mouth daily.   Yes [provider]  topiramate (TOPAMAX) 50 MG tablet Take 1 tablet (50 mg total) by mouth 2 (two) times daily. 10/30/16 10/30/17 Yes Gillis Santa, MD  zolpidem (AMBIEN) 5 MG tablet Take 5 mg by mouth at bedtime. 05/21/16  Yes [provider]  dicyclomine (BENTYL) 10 MG capsule Take 1 capsule (10 mg total) by mouth 3 (three) times daily as needed for spasms (abdominal discomfort). 11/13/16 11/27/16  Hinda Kehr, MD  ibuprofen (ADVIL,MOTRIN) 800 MG tablet Take 800  mg by mouth every 8 (eight) hours as needed.    [provider]  ondansetron (ZOFRAN) 4 MG tablet Take 1 tablet (4 mg total) by mouth every 6 (six) hours as needed for nausea. Patient not taking: Reported on 09/19/2016 06/24/16   Henreitta Leber, MD  VIIBRYD STARTER PACK 10 & 20 MG KIT  11/09/16   [provider]    Allergies Other and Tape  Family History  Problem Relation Age of Onset  . Stroke Mother     Social History Social History  Substance Use Topics  . Smoking status: Passive Smoke Exposure - Never Smoker  .  Smokeless tobacco: Never Used  . Alcohol use 2.4 oz/week    4 Shots of liquor per week    Review of Systems Constitutional: No fever/chills Eyes: No visual changes. ENT: No sore throat. Cardiovascular: Denies chest pain. Respiratory: Denies shortness of breath. Gastrointestinal: upper abdominal pain primarily on the left side that occasionally radiates through to her back. one episode of emesis, intermittent nausea, chronic constipation, no diarrhea Genitourinary: Negative for dysuria. Musculoskeletal: Negative for neck pain.  Negative for back pain. Integumentary: Negative for rash. Neurological: Negative for headaches, focal weakness or numbness.   ____________________________________________   PHYSICAL EXAM:  VITAL SIGNS: ED Triage Vitals [11/13/16 1059]  Enc Vitals Group     BP (!) 145/88     Pulse Rate 100     Resp 20     Temp 98 F (36.7 C)     Temp Source Oral     SpO2 98 %     Weight 95.3 kg (210 lb)     Height 1.676 m (_0 )     Head Circumference      Peak Flow      Pain Score 8     Pain Loc      Pain Edu?      Excl. in [place]?     Constitutional: Alert and oriented. Well appearing and in no acute distress. Eyes: Conjunctivae are normal.  Head: Atraumatic. Nose: No congestion/rhinnorhea. Mouth/Throat: Mucous membranes are moist. Neck: No stridor.  No meningeal signs.   Cardiovascular: Normal rate, regular rhythm. Good peripheral circulation. Grossly normal heart sounds. Respiratory: Normal respiratory effort.  No retractions. Lungs CTAB. Gastrointestinal: Soft and nondistended.  Mild tenderness to palpation of the epigastrium and the left upper quadrant.  Negative Murphy's sign with no significant tenderness in the right upper quadrant.  No tenderness in the lower abdomen including at McBurney's point. or rebound and no guarding. Musculoskeletal: No lower extremity tenderness nor edema. No gross deformities of extremities. Neurologic:  Normal speech and  language. No gross focal neurologic deficits are appreciated.  Skin:  Skin is warm, dry and intact. No rash noted. Psychiatric: Mood and affect are normal. Speech and behavior are normal.  ____________________________________________   LABS (all labs ordered are listed, but only abnormal results are displayed)  Labs Reviewed  COMPREHENSIVE METABOLIC PANEL - Abnormal; Notable for the following:       Result Value   Potassium 5.2 (*)    Chloride 99 (*)    Glucose, Bld 177 (*)    Creatinine, Ser 1.06 (*)    All other components within normal limits  CBC - Abnormal; Notable for the following:    WBC 11.6 (*)    RBC 5.48 (*)    Hemoglobin 16.3 (*)    HCT 48.9 (*)    All other components within normal limits  LIPASE, BLOOD  URINALYSIS, COMPLETE (UACMP) WITH MICROSCOPIC   ____________________________________________  EKG  None - EKG not ordered by ED physician ____________________________________________  RADIOLOGY   Dg Chest 2 View  Result Date: 11/13/2016 CLINICAL DATA:  RIGHT upper quadrant pain since Thursday, single episode of vomiting, LEFT upper quadrant versus lower LEFT chest pain EXAM: CHEST  2 VIEW COMPARISON:  06/15/2016 FINDINGS: Normal heart size, mediastinal contours, and pulmonary vascularity. Bibasilar atelectasis. Lungs otherwise clear. No infiltrate, pleural effusion or pneumothorax. Bones unremarkable. IMPRESSION: Bibasilar atelectasis. Electronically Signed   By: Lavonia Dana M.D.   On: 11/13/2016 12:59   US Abdomen Complete  Result Date: 11/13/2016 CLINICAL DATA:  Acute epigastric abdominal pain. EXAM: ABDOMEN ULTRASOUND COMPLETE COMPARISON:  None. FINDINGS: Gallbladder: No gallstones or wall thickening visualized. No sonographic Murphy sign noted by sonographer. Common bile duct: Diameter: 3.3 mm which is within normal limits. Liver: No focal lesion identified. Increased echogenicity of hepatic parenchyma is noted. Portal vein is patent on color Doppler  imaging with normal direction of blood flow towards the liver. IVC: No abnormality visualized. Pancreas: Not well visualized due to overlying bowel gas. Spleen: Size and appearance within normal limits. Right Kidney: Length: 12.6 cm. Echogenicity within normal limits. No mass or hydronephrosis visualized. Left Kidney: Length: 7.3 cm. Echogenicity within normal limits. No mass or hydronephrosis visualized. Abdominal aorta: No aneurysm visualized. Other findings: None. IMPRESSION: Pancreas not visualized due to overlying bowel gas. Fatty infiltration of the liver. Left renal atrophy. No other abnormality seen in the abdomen. Electronically Signed   By: Marijo Conception, M.D.   On: 11/13/2016 13:44    ____________________________________________   PROCEDURES  Critical Care performed: No   Procedure(s) performed:   Procedures   ____________________________________________   INITIAL IMPRESSION / ASSESSMENT AND PLAN / ED COURSE  As part of my medical decision making, I reviewed the following data within the [place] notes reviewed and incorporated, Labs reviewed , Old chart reviewed, Notes from prior ED visits and [place] Controlled Substance Database  Differential diagnosis includes, but is not limited to, biliary disease (biliary colic, acute cholecystitis, cholangitis, choledocholithiasis, etc), intrathoracic causes for epigastric abdominal pain including ACS, gastritis, duodenitis, pancreatitis, small bowel or large bowel obstruction, abdominal aortic aneurysm, hernia, and gastritis.  However, the symptoms have been going on for 5 days and she has had only one episode of emesis.  Given her history pancreatitis is most likely, but her lipase today is normal. review of her lab results is notable for a very slight elevation of potassium at 5.2, normal renal function, and a CBC that demonstrates only a very mild leukocytosis of 11.6.  Vital signs are essentially normal with  borderline tachycardia.  This also could be an exacerbation of her chronic pain syndrome(s).  Given that she has had multiple recent CT scans and more in the past, I am reluctant to subject her to additional heavy doses of radiation and I explained that to her.  Given that she has a normal lipase today, I think that an evaluation with a complete abdominal ultrasound would be appropriate to rule out any acute or emergent conditions.  Additionally I will check a two-view chest x-ray to make sure she does not have any evidence of free air under the diaphragm (highly unlikely), a left lower lobe pneumonia which may account for the pain she is experiencing in the left upper quadrant of her abdomen, or other acute intrathoracic pathology.  She understands and agrees with the plan.  Additionally, I reviewed the patient's prescription history over the last 12 months in the multi-state controlled substances database(s) that includes [place], [place], [place], [place], [place], [place], [place], [place], [place], [place], [place], [place], [place], and [place].  Results over the last two years were notable for Prescriptions for Zolpidem, guaifensesin AC cough syrup, and one Rx for Tramadol, but she has had no other prescriptions filled for opioids during that time.  .  Clinical Course as of Nov 14 1447  Tue Nov 13, 2016  1445 Patient was resting comfortably when I checked on her.  I updated her about the reassuring results on her ultrasound and her chest x-ray which showed no acute or emergent medical conditions.  She feels better at this time and is not reporting any pain.  I will give her a prescription for a few Bentyl to see if this helps and she has multiple outpatient providers with whom she can follow-up.  I gave my usual and customary return precautions.     [CF]    Clinical Course User Index [CF] Hinda Kehr, MD     ____________________________________________  FINAL CLINICAL IMPRESSION(S) / ED DIAGNOSES  Final diagnoses:  Upper abdominal pain     MEDICATIONS GIVEN DURING THIS VISIT:  Medications - No data to display   NEW OUTPATIENT MEDICATIONS STARTED DURING THIS VISIT:  New Prescriptions   DICYCLOMINE (BENTYL) 10 MG CAPSULE    Take 1 capsule (10 mg total) by mouth 3 (three) times daily as needed for spasms (abdominal discomfort).    Modified Medications   No medications on file    Discontinued Medications   DULOXETINE (CYMBALTA) 60 MG CAPSULE    Take 60 mg by mouth 2 (two) times daily.   GABAPENTIN (NEURONTIN) 300 MG CAPSULE    Take by mouth.     Note:  This document was prepared using Dragon voice recognition software and may include unintentional dictation errors.    Hinda Kehr, MD 11/13/16 [phone]

## 2016-11-13 NOTE — ED Triage Notes (Signed)
Pt complains of right upper quad pain since Thursday, pt reports one episode of vomiting, [facility]

## 2016-11-13 NOTE — ED Notes (Addendum)
Pt reports stomach and back pain that started Thursday - she states she has a hx of pancreatitis and was admitted the first of the year - Dr York CeriseForbach in room assessing t at this time

## 2016-11-19 ENCOUNTER — Other Ambulatory Visit: Payer: Self-pay | Admitting: Student

## 2016-11-19 DIAGNOSIS — R1013 Epigastric pain: Secondary | ICD-10-CM

## 2016-11-20 ENCOUNTER — Ambulatory Visit
Admission: RE | Admit: 2016-11-20 | Discharge: 2016-11-20 | Disposition: A | Discharge status: Home / Self Care | Payer: Medicare HMO | Source: Ambulatory Visit | Attending: Student | Admitting: Student

## 2016-11-20 DIAGNOSIS — R1013 Epigastric pain: Secondary | ICD-10-CM | POA: Insufficient documentation

## 2016-11-20 DIAGNOSIS — Z8719 Personal history of other diseases of the digestive system: Secondary | ICD-10-CM | POA: Diagnosis not present

## 2016-11-20 DIAGNOSIS — N261 Atrophy of kidney (terminal): Secondary | ICD-10-CM | POA: Diagnosis not present

## 2016-11-20 DIAGNOSIS — R112 Nausea with vomiting, unspecified: Secondary | ICD-10-CM | POA: Diagnosis not present

## 2016-11-20 DIAGNOSIS — R1110 Vomiting, unspecified: Secondary | ICD-10-CM | POA: Diagnosis not present

## 2016-11-20 DIAGNOSIS — N83201 Unspecified ovarian cyst, right side: Secondary | ICD-10-CM | POA: Insufficient documentation

## 2016-11-20 DIAGNOSIS — R109 Unspecified abdominal pain: Secondary | ICD-10-CM | POA: Diagnosis not present

## 2016-11-20 MED ORDER — IOPAMIDOL (ISOVUE-370) INJECTION 76%
100.0000 mL | Freq: Once | INTRAVENOUS | Status: AC | PRN
  Administered 2016-11-20: 100 mL via INTRAVENOUS

## 2016-11-30 DIAGNOSIS — E119 Type 2 diabetes mellitus without complications: Secondary | ICD-10-CM | POA: Diagnosis not present

## 2016-11-30 DIAGNOSIS — E781 Pure hyperglyceridemia: Secondary | ICD-10-CM | POA: Diagnosis not present

## 2016-12-07 DIAGNOSIS — R1031 Right lower quadrant pain: Secondary | ICD-10-CM | POA: Diagnosis not present

## 2016-12-07 DIAGNOSIS — E781 Pure hyperglyceridemia: Secondary | ICD-10-CM | POA: Diagnosis not present

## 2016-12-07 DIAGNOSIS — N83201 Unspecified ovarian cyst, right side: Secondary | ICD-10-CM | POA: Diagnosis not present

## 2016-12-07 DIAGNOSIS — E119 Type 2 diabetes mellitus without complications: Secondary | ICD-10-CM | POA: Diagnosis not present

## 2016-12-25 ENCOUNTER — Ambulatory Visit
Payer: Medicare HMO | Attending: Student in an Organized Health Care Education/Training Program | Admitting: Student in an Organized Health Care Education/Training Program

## 2016-12-25 ENCOUNTER — Encounter: Payer: Self-pay | Admitting: Student in an Organized Health Care Education/Training Program

## 2016-12-25 VITALS — BP 122/83 | HR 89 | Temp 98.2°F | Resp 16 | Ht 65.0 in | Wt 200.0 lb

## 2016-12-25 DIAGNOSIS — F419 Anxiety disorder, unspecified: Secondary | ICD-10-CM | POA: Insufficient documentation

## 2016-12-25 DIAGNOSIS — R52 Pain, unspecified: Secondary | ICD-10-CM | POA: Diagnosis not present

## 2016-12-25 DIAGNOSIS — M7918 Myalgia, other site: Secondary | ICD-10-CM | POA: Insufficient documentation

## 2016-12-25 DIAGNOSIS — F1020 Alcohol dependence, uncomplicated: Secondary | ICD-10-CM | POA: Insufficient documentation

## 2016-12-25 DIAGNOSIS — K859 Acute pancreatitis without necrosis or infection, unspecified: Secondary | ICD-10-CM | POA: Diagnosis not present

## 2016-12-25 DIAGNOSIS — G894 Chronic pain syndrome: Secondary | ICD-10-CM | POA: Diagnosis not present

## 2016-12-25 DIAGNOSIS — R109 Unspecified abdominal pain: Secondary | ICD-10-CM | POA: Diagnosis not present

## 2016-12-25 DIAGNOSIS — M545 Low back pain, unspecified: Secondary | ICD-10-CM

## 2016-12-25 DIAGNOSIS — E119 Type 2 diabetes mellitus without complications: Secondary | ICD-10-CM | POA: Insufficient documentation

## 2016-12-25 DIAGNOSIS — F329 Major depressive disorder, single episode, unspecified: Secondary | ICD-10-CM | POA: Insufficient documentation

## 2016-12-25 DIAGNOSIS — N261 Atrophy of kidney (terminal): Secondary | ICD-10-CM | POA: Insufficient documentation

## 2016-12-25 DIAGNOSIS — Z79899 Other long term (current) drug therapy: Secondary | ICD-10-CM | POA: Diagnosis not present

## 2016-12-25 DIAGNOSIS — Z7984 Long term (current) use of oral hypoglycemic drugs: Secondary | ICD-10-CM | POA: Diagnosis not present

## 2016-12-25 DIAGNOSIS — G7100 Muscular dystrophy, unspecified: Secondary | ICD-10-CM

## 2016-12-25 DIAGNOSIS — M79604 Pain in right leg: Secondary | ICD-10-CM | POA: Insufficient documentation

## 2016-12-25 DIAGNOSIS — N83201 Unspecified ovarian cyst, right side: Secondary | ICD-10-CM | POA: Insufficient documentation

## 2016-12-25 DIAGNOSIS — G8929 Other chronic pain: Secondary | ICD-10-CM

## 2016-12-25 DIAGNOSIS — F1410 Cocaine abuse, uncomplicated: Secondary | ICD-10-CM | POA: Diagnosis not present

## 2016-12-25 DIAGNOSIS — E785 Hyperlipidemia, unspecified: Secondary | ICD-10-CM | POA: Diagnosis not present

## 2016-12-25 DIAGNOSIS — M791 Myalgia, unspecified site: Secondary | ICD-10-CM | POA: Diagnosis not present

## 2016-12-25 MED ORDER — PREGABALIN 100 MG PO CAPS: ORAL_CAPSULE | ORAL | 1 refills | Status: DC

## 2016-12-25 MED ORDER — TOPIRAMATE 50 MG PO TABS: 50.0000 mg | ORAL_TABLET | Freq: Two times a day (BID) | ORAL | 2 refills | Status: DC

## 2016-12-25 NOTE — Progress Notes (Signed)
Patient's Name: Meredith Williams  MRN: [id number]  Referring Provider: Dion Body, MD  DOB: 10-Feb-1980  PCP: Dion Body, MD  DOS: 12/25/2016  Note by: Gillis Santa, MD  Service setting: Ambulatory outpatient  Specialty: Interventional Pain Management  Location: [facility] (AMB) Pain Management Facility    Patient type: Established   Primary Reason(s) for Visit: Encounter for prescription drug management. (Level of risk: moderate)  CC: Generalized Body Aches (mytonic muscular dystrophy)  HPI  Meredith Williams is a 36 y.o. year old, female patient, who comes today for a medication management evaluation. She has Acute pancreatitis and Pancreatitis on their problem list. Her primarily concern today is the Generalized Body Aches (mytonic muscular dystrophy)  Pain Assessment: Location: Left, Right Knee Radiating: na Onset: More than a month ago Duration: Chronic pain Quality: Aching, Stabbing, Constant Severity: 8 /10 (self-reported pain score)  Note: Reported level is inconsistent with clinical observations. Clinically the patient looks like a 2/10 A 2/10 is viewed as "Mild to Moderate" and described as noticeable and distracting. Impossible to hide from other people. More frequent flare-ups. Still possible to adapt and function close to normal. It can be very annoying and may have occasional stronger flare-ups. With discipline, patients may get used to it and adapt.       When using our objective Pain Scale, levels between 6 and 10/10 are said to belong in an emergency room, as it progressively worsens from a 6/10, described as severely limiting, requiring emergency care not usually available at an outpatient pain management facility. At a 6/10 level, communication becomes difficult and requires great effort. Assistance to reach the emergency department may be required. Facial flushing and profuse sweating along with potentially dangerous increases in heart rate and blood pressure will be evident. Effect  on ADL: longer activities increase the pain Timing: Constant Modifying factors: laying down and not bending it.   Meredith Williams was last scheduled for an appointment on 11/10/2016 for medication management. During today's appointment we reviewed Meredith Williams's chronic pain status, as well as her outpatient medication regimen.  Patient is a 36 year old female with multiple psychiatric comorbidities who presents with diffuse whole-body muscle aches secondary to muscular dystrophy that she was diagnosed with many years ago. Patient describes pain in various regions including her low back, bilateral thighs and legs. For pain she has tried physical therapy primarily for her hips, tens unit. Of note patient does have a history of cocaine and alcohol use.  The patient's last visit on October 30, 2016, her Topamax was increased to 50 mg twice daily.  Since then the patient has had a visit to the emergency department for upper abdominal and back pain 5 days in duration.  Patient's right upper quadrant ultrasound at that time was unremarkable and her chest x-ray did not show any acute conditions.  She then followed up with primary care provider and it was discovered that she had a hemorrhagic rupture of her cyst.  She is scheduled to obtain trigger point injection with OB/GYN physician today.  Patient states that she is still taking Topamax 50 mg twice daily without any side effects.  She does endorse some benefit in her headaches and some of her muscular dystrophic symptoms.  She finds gabapentin ineffective at 1800 mg daily dose.  She is requesting to trial Lyrica which I think is reasonable.  Kidney function within normal limits.  As discussed non-opioid management only.  The patient  reports that she does not use drugs. Her body  mass index is 33.28 kg/m.  Further details on both, my assessment(s), as well as the proposed treatment plan, please see below.  Pharmacologic Plan: No change in therapy, at this  time  Laboratory Chemistry  Inflammation Markers (CRP: Acute Phase) (ESR: Chronic Phase) Lab Results  Component Value Date   LATICACIDVEN 0.9 06/15/2016                 Rheumatology Markers No results found for: RF, ANA, Therisa [place], [facility]              Renal Function Markers Lab Results  Component Value Date   BUN 17 11/13/2016   CREATININE 1.06 (H) 11/13/2016   GFRAA >60 11/13/2016   GFRNONAA >60 11/13/2016                 Hepatic Function Markers Lab Results  Component Value Date   AST 25 11/13/2016   ALT 31 11/13/2016   ALBUMIN 4.4 11/13/2016   ALKPHOS 38 11/13/2016   LIPASE 25 11/13/2016                 Electrolytes Lab Results  Component Value Date   NA 136 11/13/2016   K 5.2 (H) 11/13/2016   CL 99 (L) 11/13/2016   CALCIUM 9.6 11/13/2016   MG 2.4 06/22/2016   PHOS 2.7 06/22/2016                 Neuropathy Markers Lab Results  Component Value Date   HIV Non Reactive 06/16/2016                 Bone Pathology Markers No results found for: [place], [id number], [id number], [id number], 25OHVITD1, 25OHVITD2, 25OHVITD3, TESTOFREE, TESTOSTERONE               Coagulation Parameters Lab Results  Component Value Date   PLT 382 11/13/2016                 Cardiovascular Markers Lab Results  Component Value Date   TROPONINI <0.03 06/15/2016   HGB 16.3 (H) 11/13/2016   HCT 48.9 (H) 11/13/2016                 CA Markers No results found for: CEA, CA125, LABCA2               Note: Lab results reviewed.  Recent Diagnostic Imaging Results  CT ABDOMEN PELVIS W CONTRAST CLINICAL DATA:  Severe mid abdominal pain, nausea and vomiting since 11/15/2016.  EXAM: CT ABDOMEN AND PELVIS WITH CONTRAST  TECHNIQUE: Multidetector CT imaging of the abdomen and pelvis was performed using the standard protocol following bolus administration of intravenous contrast.  CONTRAST:  100 cc Isovue 370 IV  COMPARISON:  06/23/2016  FINDINGS: Lower  chest: Normal size heart without pericardial effusion. Linear platelike atelectasis and/or scarring within each lower lobe with subsegmental atelectasis in the lingula and left lower lobe as well. No dominant mass, pneumonic consolidation or effusion.  Hepatobiliary: Hypodense appearance of the liver consistent with hepatic steatosis. No space-occupying mass. Normal gallbladder without stones. No biliary dilatation.  Pancreas: Mild fatty infiltration of the pancreatic head and uncinate process. No dominant mass or ductal dilatation. No inflammation.  Spleen: Normal  Adrenals/Urinary Tract: Normal bilateral adrenal glands. Atrophic left kidney with compensatory hypertrophy of the right. Retroaortic left renal vein. No obstructive uropathy. Decompressed urinary bladder without focal mural thickening or calculus.  Stomach/Bowel: Go physiologic distention of the stomach with enteric contrast. There is normal small  bowel rotation and ligament of Treitz position. No small bowel obstruction or inflammation. Normal-appearing appendix. An average amount of stool is noted within the colon. No acute large bowel abnormality.  Vascular/Lymphatic: No aortic aneurysm or dissection. No lymphadenopathy.  Reproductive: Status post hysterectomy. Peripherally enhancing 19 mm ruptured cyst or follicle of the right ovary with associated small amount of free fluid in the cul-de-sac.  Other: No abdominal wall hernia.  Musculoskeletal: No acute or significant osseous findings.  IMPRESSION: 1. Irregular peripherally enhancing 19 mm right ovarian cyst or follicle with associated free fluid in the cul-de-sac. Findings may reflect a ruptured/hemorrhagic cyst or corpus luteum. 2. Atrophic left kidney with compensatory hypertrophy of the right kidney as before. No obstructive uropathy. 3. No acute bowel obstruction or inflammation. 4. Unremarkable appearance of the pancreas with resolved peripancreatic  inflammation since prior exam. No pseudocyst formation or ductal dilatation. It should be noted that the pancreas can appear normal on CT despite clinical pancreatitis and therefore laboratory correlation is recommended.  Electronically Signed   By: Ashley Royalty M.D.   On: 11/20/2016 14:35  Complexity Note: Imaging results reviewed. Results shared with Meredith Williams, using Layman's terms.                         Meds   Current Outpatient Medications:  .  aspirin EC 81 MG tablet, Take 81 mg by mouth daily., Disp: , Rfl:  .  cyclobenzaprine (FLEXERIL) 10 MG tablet, Take 15 mg by mouth 2 (two) times daily as needed for muscle spasms., Disp: , Rfl: 0 .  fenofibrate 160 MG tablet, Take 160 mg by mouth daily., Disp: , Rfl: 0 .  ibuprofen (ADVIL,MOTRIN) 800 MG tablet, Take 800 mg by mouth every 8 (eight) hours as needed., Disp: , Rfl:  .  metFORMIN (GLUCOPHAGE) 500 MG tablet, Take 500 mg by mouth daily., Disp: , Rfl:  .  topiramate (TOPAMAX) 50 MG tablet, Take 1 tablet (50 mg total) by mouth 2 (two) times daily., Disp: 60 tablet, Rfl: 2 .  zolpidem (AMBIEN) 5 MG tablet, Take 5 mg by mouth at bedtime., Disp: , Rfl: 0 .  busPIRone (BUSPAR) 15 MG tablet, Take 15 mg by mouth 2 (two) times daily., Disp: , Rfl: 0 .  dicyclomine (BENTYL) 10 MG capsule, Take 1 capsule (10 mg total) by mouth 3 (three) times daily as needed for spasms (abdominal discomfort)., Disp: 30 capsule, Rfl: 0 .  ondansetron (ZOFRAN) 4 MG tablet, Take 1 tablet (4 mg total) by mouth every 6 (six) hours as needed for nausea. (Patient not taking: Reported on 09/19/2016), Disp: 20 tablet, Rfl: 0 .  pregabalin (LYRICA) 100 MG capsule, 100 mg BID x 2 weeks then increase to 100 mg TID, Disp: 90 capsule, Rfl: 1 .  VIIBRYD STARTER PACK 10 & 20 MG KIT, , Disp: , Rfl: 0  ROS  Constitutional: Denies any fever or chills Gastrointestinal: No reported hemesis, hematochezia, vomiting, or acute GI distress Musculoskeletal: Denies any acute onset joint  swelling, redness, loss of ROM, or weakness Neurological: No reported episodes of acute onset apraxia, aphasia, dysarthria, agnosia, amnesia, paralysis, loss of coordination, or loss of consciousness  Allergies  Meredith Williams is allergic to other and tape.  PFSH  Drug: Meredith Williams  reports that she does not use drugs. Alcohol:  reports that she drinks about 2.4 oz of alcohol per week. Tobacco:  reports that she is a non-smoker but has been exposed to tobacco  smoke. she has never used smokeless tobacco. Medical:  has a past medical history of Diabetes mellitus without complication ([place]), Hyperlipidemia, Muscular dystrophy, myotonic ([place]), and Pancreatitis. Surgical: Meredith Williams  has a past surgical history that includes Abdominal hysterectomy and Hip surgery (Right, 2016). Family: family history includes Stroke in her mother.  Constitutional Exam  General appearance: Well nourished, well developed, and well hydrated. In no apparent acute distress Vitals:   12/25/16 0830  BP: 122/83  Pulse: 89  Resp: 16  Temp: 98.2 F (36.8 C)  TempSrc: Oral  SpO2: 100%  Weight: 200 lb (90.7 kg)  Height: '5\' 5"'$  (1.651 m)   BMI Assessment: Estimated body mass index is 33.28 kg/m as calculated from the following:   Height as of this encounter: '5\' 5"'$  (1.651 m).   Weight as of this encounter: 200 lb (90.7 kg).  BMI interpretation table: BMI level Category Range association with higher incidence of chronic pain  <18 kg/m2 Underweight   18.5-24.9 kg/m2 Ideal body weight   25-29.9 kg/m2 Overweight Increased incidence by 20%  30-34.9 kg/m2 Obese (Class I) Increased incidence by 68%  35-39.9 kg/m2 Severe obesity (Class II) Increased incidence by 136%  >40 kg/m2 Extreme obesity (Class III) Increased incidence by 254%   BMI Readings from Last 4 Encounters:  12/25/16 33.28 kg/m  11/13/16 33.89 kg/m  10/30/16 32.28 kg/m  09/19/16 32.12 kg/m   Wt Readings from Last 4 Encounters:  12/25/16 200 lb (90.7 kg)   11/13/16 210 lb (95.3 kg)  10/30/16 200 lb (90.7 kg)  09/19/16 199 lb (90.3 kg)  Psych/Mental status: Alert, oriented x 3 (person, place, & time)       Eyes: PERLA Respiratory: No evidence of acute respiratory distress  Cervical Spine Area Exam  Skin & Axial Inspection: No masses, redness, edema, swelling, or associated skin lesions Alignment: Symmetrical Functional ROM: Unrestricted ROM      Stability: No instability detected Muscle Tone/Strength: Functionally intact. No obvious neuro-muscular anomalies detected. Sensory (Neurological): Unimpaired Palpation: No palpable anomalies              Upper Extremity (UE) Exam    Side: Right upper extremity  Side: Left upper extremity  Skin & Extremity Inspection: Skin color, temperature, and hair growth are WNL. No peripheral edema or cyanosis. No masses, redness, swelling, asymmetry, or associated skin lesions. No contractures.  Skin & Extremity Inspection: Skin color, temperature, and hair growth are WNL. No peripheral edema or cyanosis. No masses, redness, swelling, asymmetry, or associated skin lesions. No contractures.  Functional ROM: Unrestricted ROM          Functional ROM: Unrestricted ROM          Muscle Tone/Strength: Functionally intact. No obvious neuro-muscular anomalies detected.  Muscle Tone/Strength: Functionally intact. No obvious neuro-muscular anomalies detected.  Sensory (Neurological): Unimpaired          Sensory (Neurological): Unimpaired          Palpation: No palpable anomalies              Palpation: No palpable anomalies              Specialized Test(s): Deferred         Specialized Test(s): Deferred          Thoracic Spine Area Exam  Skin & Axial Inspection: No masses, redness, or swelling Alignment: Symmetrical Functional ROM: Unrestricted ROM Stability: No instability detected Muscle Tone/Strength: Functionally intact. No obvious neuro-muscular anomalies detected. Sensory (Neurological): Unimpaired Muscle  strength & Tone: No palpable anomalies  Lumbar Spine Area Exam  Skin & Axial Inspection: No masses, redness, or swelling Alignment: Symmetrical Functional ROM: Unrestricted ROM      Stability: No instability detected Muscle Tone/Strength: Functionally intact. No obvious neuro-muscular anomalies detected. Sensory (Neurological): Unimpaired Palpation: No palpable anomalies       Provocative Tests: Lumbar Hyperextension and rotation test: evaluation deferred today       Lumbar Lateral bending test: evaluation deferred today       Patrick's Maneuver: evaluation deferred today                    Gait & Posture Assessment  Ambulation: Unassisted Gait: Relatively normal for age and body habitus Posture: WNL   Lower Extremity Exam    Side: Right lower extremity  Side: Left lower extremity  Skin & Extremity Inspection: Skin color, temperature, and hair growth are WNL. No peripheral edema or cyanosis. No masses, redness, swelling, asymmetry, or associated skin lesions. No contractures.  Skin & Extremity Inspection: Skin color, temperature, and hair growth are WNL. No peripheral edema or cyanosis. No masses, redness, swelling, asymmetry, or associated skin lesions. No contractures.  Functional ROM: Unrestricted ROM          Functional ROM: Unrestricted ROM          Muscle Tone/Strength: Functionally intact. No obvious neuro-muscular anomalies detected.  Muscle Tone/Strength: Functionally intact. No obvious neuro-muscular anomalies detected.  Sensory (Neurological): Unimpaired  Sensory (Neurological): Unimpaired  Palpation: No palpable anomalies  Palpation: No palpable anomalies   Assessment  Primary Diagnosis & Pertinent Problem List: The primary encounter diagnosis was Chronic pain syndrome. Diagnoses of Muscular dystrophy, Chronic bilateral low back pain without sciatica, and Myalgia were also pertinent to this visit.  Status Diagnosis  Persistent Persistent Persistent 1. Chronic pain  syndrome   2. Muscular dystrophy   3. Chronic bilateral low back pain without sciatica   4. Myalgia       36 year old female with prominent psychiatric history and history of substance abuse (including alcohol and cocaine) who presents with diffuse myalgias secondary to muscular dystrophy. Patient also endorses migraines for which she receives sphenopalatine ganglion nerve blocks with her neurologist. Patient is high risk for opioid prescribing. The patient will not be a candidate for opioid therapy through this clinic. Patient will be optimized on non-opioid adjunctive analgesics and a comprehensive pain management plan was developed.  Patient recently had ED visit secondary to right lower extremity pain.  Workup at that time which included an abdominal ultrasound was negative for any acute intra-abdominal process however patient was known to have an active pelvic process which included a right ruptured ovarian cyst with free fluid in the cul-de-sac.  This could be contributing to her dull achy pain which is visceral in nature.  Patient does have an appointment with psychiatrist Dr. Shea Evans which I think will be effective in managing her depression and anxiety.  Because the patient is not endorsing any significant benefit from gabapentin at a dose of 1800 mg daily, I will have her discontinue gabapentin and start Lyrica 100 mg twice daily and then increase to 100 mg 3 times daily.  Patient's last GFR within normal limit (<60) such that Lyrica does not require any dose adjustment at his recommended dose of 100 mg twice daily to 3 times daily as needed.  Patient can continue Topamax at current dose.  Plan: -Stop gabapentin.  Start Lyrica 100 mg twice daily then increase to  100 mg 3 times daily after 2 weeks if no side effects. -Continue Topamax at 50 mg twice daily -Continue care with OB/GYN physician including trigger point injections for pelvic myofascial pain secondary to ruptured ovarian  cyst -Continue psychiatric care with Dr. Shea Evans to assist with depression, anxiety, pain coping, and CBT exercises. -Follow-up in 1 month to discuss Lyrica titration.   Plan of Care  Pharmacotherapy (Medications Ordered): Meds ordered this encounter  Medications  . pregabalin (LYRICA) 100 MG capsule    Sig: 100 mg BID x 2 weeks then increase to 100 mg TID    Dispense:  90 capsule    Refill:  1    Do not place this medication, or any other prescription from our practice, on "Automatic Refill". Patient may have prescription filled one day early if pharmacy is closed on scheduled refill date.  . topiramate (TOPAMAX) 50 MG tablet    Sig: Take 1 tablet (50 mg total) by mouth 2 (two) times daily.    Dispense:  60 tablet    Refill:  2    Provider-requested follow-up: Return in about 4 weeks (around 01/22/2017) for Medication Management.  Future Appointments  Date Time Provider [place]  01/04/2017  9:15 AM Ursula Alert, MD [facility] None  01/17/2017  8:45 AM Gillis Santa, MD [facility] None    Primary Care Physician: Dion Body, MD Location: [facility] Note by: Gillis Santa, M.D Date: 12/25/2016; Time: 9:02 AM  Patient Instructions  1.  Stop gabapentin.  Start Lyrica 100 mg twice daily.  After 2 weeks can increase to 100 mg 3 times daily if no side effects. 2.  Refill of Topamax at current dose of 50 mg twice daily. 3.  Follow-up in 1 month.

## 2016-12-25 NOTE — Patient Instructions (Addendum)
1.  Stop gabapentin.  Start Lyrica 100 mg twice daily.  After 2 weeks can increase to 100 mg 3 times daily if no side effects. 2.  Refill of Topamax at current dose of 50 mg twice daily. 3.  Follow-up in 1 month.

## 2016-12-25 NOTE — Progress Notes (Signed)
Safety precautions to be maintained throughout the outpatient stay will include: orient to surroundings, keep bed in low position, maintain call bell within reach at all times, provide assistance with transfer out of bed and ambulation.  

## 2016-12-27 DIAGNOSIS — G7111 Myotonic muscular dystrophy: Secondary | ICD-10-CM | POA: Diagnosis not present

## 2016-12-27 DIAGNOSIS — G43719 Chronic migraine without aura, intractable, without status migrainosus: Secondary | ICD-10-CM | POA: Diagnosis not present

## 2016-12-27 DIAGNOSIS — G4733 Obstructive sleep apnea (adult) (pediatric): Secondary | ICD-10-CM | POA: Diagnosis not present

## 2016-12-27 DIAGNOSIS — E538 Deficiency of other specified B group vitamins: Secondary | ICD-10-CM | POA: Diagnosis not present

## 2017-01-02 ENCOUNTER — Encounter: Payer: Self-pay | Admitting: Student in an Organized Health Care Education/Training Program

## 2017-01-04 ENCOUNTER — Encounter: Payer: Self-pay | Admitting: Psychiatry

## 2017-01-04 ENCOUNTER — Other Ambulatory Visit: Payer: Self-pay

## 2017-01-04 ENCOUNTER — Ambulatory Visit (INDEPENDENT_AMBULATORY_CARE_PROVIDER_SITE_OTHER): Payer: Medicare HMO | Admitting: Psychiatry

## 2017-01-04 VITALS — BP 143/91 | HR 114 | Temp 98.4°F | Wt 199.2 lb

## 2017-01-04 DIAGNOSIS — F329 Major depressive disorder, single episode, unspecified: Secondary | ICD-10-CM | POA: Insufficient documentation

## 2017-01-04 DIAGNOSIS — F411 Generalized anxiety disorder: Secondary | ICD-10-CM

## 2017-01-04 DIAGNOSIS — F32A Depression, unspecified: Secondary | ICD-10-CM | POA: Insufficient documentation

## 2017-01-04 DIAGNOSIS — R030 Elevated blood-pressure reading, without diagnosis of hypertension: Secondary | ICD-10-CM | POA: Diagnosis not present

## 2017-01-04 DIAGNOSIS — F4010 Social phobia, unspecified: Secondary | ICD-10-CM

## 2017-01-04 DIAGNOSIS — F419 Anxiety disorder, unspecified: Secondary | ICD-10-CM | POA: Insufficient documentation

## 2017-01-04 DIAGNOSIS — F331 Major depressive disorder, recurrent, moderate: Secondary | ICD-10-CM

## 2017-01-04 DIAGNOSIS — G4700 Insomnia, unspecified: Secondary | ICD-10-CM | POA: Insufficient documentation

## 2017-01-04 DIAGNOSIS — F5105 Insomnia due to other mental disorder: Secondary | ICD-10-CM

## 2017-01-04 MED ORDER — HYDROXYZINE PAMOATE 25 MG PO CAPS: 25.0000 mg | ORAL_CAPSULE | Freq: Three times a day (TID) | ORAL | 1 refills | Status: DC | PRN

## 2017-01-04 MED ORDER — VENLAFAXINE HCL ER 37.5 MG PO CP24
37.5000 mg | ORAL_CAPSULE | Freq: Every day | ORAL | 1 refills | Status: DC
Start: 2017-01-04 — End: 2017-02-28

## 2017-01-04 MED ORDER — ZOLPIDEM TARTRATE 5 MG PO TABS: 5.0000 mg | ORAL_TABLET | Freq: Every evening | ORAL | 1 refills | Status: DC | PRN

## 2017-01-04 NOTE — Patient Instructions (Signed)
Hydroxyzine capsules or tablets What is this medicine? HYDROXYZINE (hye DROX i zeen) is an antihistamine. This medicine is used to treat allergy symptoms. It is also used to treat anxiety and tension. This medicine can be used with other medicines to induce sleep before surgery. This medicine may be used for other purposes; ask your health care provider or pharmacist if you have questions. COMMON BRAND NAME(S): ANX, Atarax, Rezine, Vistaril What should I tell my health care provider before I take this medicine? They need to know if you have any of these conditions: -any chronic illness -difficulty passing urine -glaucoma -heart disease -kidney disease -liver disease -lung disease -an unusual or allergic reaction to hydroxyzine, cetirizine, other medicines, foods, dyes, or preservatives -pregnant or trying to get pregnant -breast-feeding How should I use this medicine? Take this medicine by mouth with a full glass of water. Follow the directions on the prescription label. You may take this medicine with food or on an empty stomach. Take your medicine at regular intervals. Do not take your medicine more often than directed. Talk to your pediatrician regarding the use of this medicine in children. Special care may be needed. While this drug may be prescribed for children as young as 6 years of age for selected conditions, precautions do apply. Patients over 65 years old may have a stronger reaction and need a smaller dose. Overdosage: If you think you have taken too much of this medicine contact a poison control center or emergency room at once. NOTE: This medicine is only for you. Do not share this medicine with others. What if I miss a dose? If you miss a dose, take it as soon as you can. If it is almost time for your next dose, take only that dose. Do not take double or extra doses. What may interact with this medicine? -alcohol -barbiturate medicines for sleep or seizures -medicines for  colds, allergies -medicines for depression, anxiety, or emotional disturbances -medicines for pain -medicines for sleep -muscle relaxants This list may not describe all possible interactions. Give your health care provider a list of all the medicines, herbs, non-prescription drugs, or dietary supplements you use. Also tell them if you smoke, drink alcohol, or use illegal drugs. Some items may interact with your medicine. What should I watch for while using this medicine? Tell your doctor or health care professional if your symptoms do not improve. You may get drowsy or dizzy. Do not drive, use machinery, or do anything that needs mental alertness until you know how this medicine affects you. Do not stand or sit up quickly, especially if you are an older patient. This reduces the risk of dizzy or fainting spells. Alcohol may interfere with the effect of this medicine. Avoid alcoholic drinks. Your mouth may get dry. Chewing sugarless gum or sucking hard candy, and drinking plenty of water may help. Contact your doctor if the problem does not go away or is severe. This medicine may cause dry eyes and blurred vision. If you wear contact lenses you may feel some discomfort. Lubricating drops may help. See your eye doctor if the problem does not go away or is severe. If you are receiving skin tests for allergies, tell your doctor you are using this medicine. What side effects may I notice from receiving this medicine? Side effects that you should report to your doctor or health care professional as soon as possible: -fast or irregular heartbeat -difficulty passing urine -seizures -slurred speech or confusion -tremor Side effects that   usually do not require medical attention (report to your doctor or health care professional if they continue or are bothersome): -constipation -drowsiness -fatigue -headache -stomach upset This list may not describe all possible side effects. Call your doctor for  medical advice about side effects. You may report side effects to FDA at 1-800-FDA-1088. Where should I keep my medicine? Keep out of the reach of children. Store at room temperature between 15 and 30 degrees C (59 and 86 degrees F). Keep container tightly closed. Throw away any unused medicine after the expiration date. NOTE: This sheet is a summary. It may not cover all possible information. If you have questions about this medicine, talk to your doctor, pharmacist, or health care provider.  2018 [employer]/Gold Standard (2007-05-23 14:50:59) Zolpidem tablets What is this medicine? ZOLPIDEM (zole PI dem) is used to treat insomnia. This medicine helps you to fall asleep and sleep through the night. This medicine may be used for other purposes; ask your health care provider or pharmacist if you have questions. COMMON BRAND NAME(S): Ambien What should I tell my health care provider before I take this medicine? They need to know if you have any of these conditions: -depression -history of drug abuse or addiction -if you often drink alcohol -liver disease -lung or breathing disease -myasthenia gravis -sleep apnea -suicidal thoughts, plans, or attempt; a previous suicide attempt by you or a family member -an unusual or allergic reaction to zolpidem, other medicines, foods, dyes, or preservatives -pregnant or trying to get pregnant -breast-feeding How should I use this medicine? Take this medicine by mouth with a glass of water. Follow the directions on the prescription label. It is better to take this medicine on an empty stomach and only when you are ready for bed. Do not take your medicine more often than directed. If you have been taking this medicine for several weeks and suddenly stop taking it, you may get unpleasant withdrawal symptoms. Your doctor or health care professional may want to gradually reduce the dose. Do not stop taking this medicine on your own. Always follow your doctor or  health care professional's advice. A special MedGuide will be given to you by the pharmacist with each prescription and refill. Be sure to read this information carefully each time. Talk to your pediatrician regarding the use of this medicine in children. Special care may be needed. Overdosage: If you think you have taken too much of this medicine contact a poison control center or emergency room at once. NOTE: This medicine is only for you. Do not share this medicine with others. What if I miss a dose? This does not apply. This medicine should only be taken immediately before going to sleep. Do not take double or extra doses. What may interact with this medicine? -alcohol -antihistamines for allergy, cough and cold -certain medicines for anxiety or sleep -certain medicines for depression, like amitriptyline, fluoxetine, sertraline -certain medicines for fungal infections like ketoconazole and itraconazole -certain medicines for seizures like phenobarbital, primidone -ciprofloxacin -dietary supplements for sleep, like valerian or kava kava -general anesthetics like halothane, isoflurane, methoxyflurane, propofol -local anesthetics like lidocaine, pramoxine, tetracaine -medicines that relax muscles for surgery -narcotic medicines for pain -phenothiazines like chlorpromazine, mesoridazine, prochlorperazine, thioridazine -rifampin This list may not describe all possible interactions. Give your health care provider a list of all the medicines, herbs, non-prescription drugs, or dietary supplements you use. Also tell them if you smoke, drink alcohol, or use illegal drugs. Some items may interact with your medicine.   What should I watch for while using this medicine? Visit your doctor or health care professional for regular checks on your progress. Keep a regular sleep schedule by going to bed at about the same time each night. Avoid caffeine-containing drinks in the evening hours. When sleep  medicines are used every night for more than a few weeks, they may stop working. Talk to your doctor if you still have trouble sleeping. After taking this medicine for sleep, you may get up out of bed while not being fully awake and do an activity that you do not know you are doing. The next morning, you may have no memory of the event. Activities such as driving a car ("sleep-driving"), making and eating food, talking on the phone, sexual activity, and sleep-walking have been reported. Call your doctor right away if you find out you have done any of these activities. Do not take this medicine if you have used alcohol that evening or before bed or taken another medicine for sleep since your risk of doing these sleep-related activities will be increased. Wait for at least 8 hours after you take a dose before driving or doing other activities that require full mental alertness. Do not take this medicine unless you are able to stay in bed for a full night (7 to 8 hours) before you must be active again. You may have a decrease in mental alertness the day after use, even if you feel that you are fully awake. Tell your doctor if you will need to perform activities requiring full alertness, such as driving, the next day. Do not stand or sit up quickly after taking this medicine, especially if you are an older patient. This reduces the risk of dizzy or fainting spells. If you or your family notice any changes in your behavior, such as new or worsening depression, thoughts of harming yourself, anxiety, other unusual or disturbing thoughts, or memory loss, call your doctor right away. After you stop taking this medicine, you may have trouble falling asleep. This is called rebound insomnia. This problem usually goes away on its own after 1 or 2 nights. What side effects may I notice from receiving this medicine? Side effects that you should report to your doctor or health care professional as soon as possible: -allergic  reactions like skin rash, itching or hives, swelling of the face, lips, or tongue -breathing problems -changes in vision -confusion -depressed mood or other changes in moods or emotions -feeling faint or lightheaded, falls -hallucinations -loss of balance or coordination -loss of memory -numbness or tingling of the tongue -restlessness, excitability, or feelings of anxiety or agitation -signs and symptoms of liver injury like dark yellow or brown urine; general ill feeling or flu-like symptoms; light-colored stools; loss of appetite; nausea; right upper belly pain; unusually weak or tired; yellowing of the eyes or skin -suicidal thoughts -unusual activities while asleep like driving, eating, making phone calls, or sexual activity Side effects that usually do not require medical attention (report to your doctor or health care professional if they continue or are bothersome): -dizziness -drowsiness the day after you take this medicine -headache This list may not describe all possible side effects. Call your doctor for medical advice about side effects. You may report side effects to FDA at 1-800-FDA-1088. Where should I keep my medicine? Keep out of the reach of children. This medicine can be abused. Keep your medicine in a safe place to protect it from theft. Do not share this medicine with   anyone. Selling or giving away this medicine is dangerous and against the law. This medicine may cause accidental overdose and death if taken by other adults, children, or pets. Mix any unused medicine with a substance like cat litter or coffee grounds. Then throw the medicine away in a sealed container like a sealed bag or a coffee can with a lid. Do not use the medicine after the expiration date. Store at room temperature between 20 and 25 degrees C (68 and 77 degrees F). NOTE: This sheet is a summary. It may not cover all possible information. If you have questions about this medicine, talk to your  doctor, pharmacist, or health care provider.  2018 [employer]/Gold Standard (2015-04-13 14:38:20) Venlafaxine extended-release capsules What is this medicine? VENLAFAXINE(VEN la fax een) is used to treat depression, anxiety and panic disorder. This medicine may be used for other purposes; ask your health care provider or pharmacist if you have questions. COMMON BRAND NAME(S): Effexor XR What should I tell my health care provider before I take this medicine? They need to know if you have any of these conditions: -bleeding disorders -glaucoma -heart disease -high blood pressure -high cholesterol -kidney disease -liver disease -low levels of sodium in the blood -mania or bipolar disorder -seizures -suicidal thoughts, plans, or attempt; a previous suicide attempt by you or a family -take medicines that treat or prevent blood clots -thyroid disease -an unusual or allergic reaction to venlafaxine, desvenlafaxine, other medicines, foods, dyes, or preservatives -pregnant or trying to get pregnant -breast-feeding How should I use this medicine? Take this medicine by mouth with a full glass of water. Follow the directions on the prescription label. Do not cut, crush, or chew this medicine. Take it with food. If needed, the capsule may be carefully opened and the entire contents sprinkled on a spoonful of cool applesauce. Swallow the applesauce/pellet mixture right away without chewing and follow with a glass of water to ensure complete swallowing of the pellets. Try to take your medicine at about the same time each day. Do not take your medicine more often than directed. Do not stop taking this medicine suddenly except upon the advice of your doctor. Stopping this medicine too quickly may cause serious side effects or your condition may worsen. A special MedGuide will be given to you by the pharmacist with each prescription and refill. Be sure to read this information carefully each time. Talk to  your pediatrician regarding the use of this medicine in children. Special care may be needed. Overdosage: If you think you have taken too much of this medicine contact a poison control center or emergency room at once. NOTE: This medicine is only for you. Do not share this medicine with others. What if I miss a dose? If you miss a dose, take it as soon as you can. If it is almost time for your next dose, take only that dose. Do not take double or extra doses. What may interact with this medicine? Do not take this medicine with any of the following medications: -certain medicines for fungal infections like fluconazole, itraconazole, ketoconazole, posaconazole, voriconazole -cisapride -desvenlafaxine -dofetilide -dronedarone -duloxetine -levomilnacipran -linezolid -MAOIs like Carbex, Eldepryl, Marplan, Nardil, and Parnate -methylene blue (injected into a vein) -milnacipran -pimozide -thioridazine -ziprasidone This medicine may also interact with the following medications: -amphetamines -aspirin and aspirin-like medicines -certain medicines for depression, anxiety, or psychotic disturbances -certain medicines for migraine headaches like almotriptan, eletriptan, frovatriptan, naratriptan, rizatriptan, sumatriptan, zolmitriptan -certain medicines for sleep -certain medicines that   treat or prevent blood clots like dalteparin, enoxaparin, warfarin -cimetidine -clozapine -diuretics -fentanyl -furazolidone -indinavir -isoniazid -lithium -metoprolol -NSAIDS, medicines for pain and inflammation, like ibuprofen or naproxen -other medicines that prolong the QT interval (cause an abnormal heart rhythm) -procarbazine -rasagiline -supplements like St. John's wort, kava kava, valerian -tramadol -tryptophan This list may not describe all possible interactions. Give your health care provider a list of all the medicines, herbs, non-prescription drugs, or dietary supplements you use. Also tell  them if you smoke, drink alcohol, or use illegal drugs. Some items may interact with your medicine. What should I watch for while using this medicine? Tell your doctor if your symptoms do not get better or if they get worse. Visit your doctor or health care professional for regular checks on your progress. Because it may take several weeks to see the full effects of this medicine, it is important to continue your treatment as prescribed by your doctor. Patients and their families should watch out for new or worsening thoughts of suicide or depression. Also watch out for sudden changes in feelings such as feeling anxious, agitated, panicky, irritable, hostile, aggressive, impulsive, severely restless, overly excited and hyperactive, or not being able to sleep. If this happens, especially at the beginning of treatment or after a change in dose, call your health care professional. This medicine can cause an increase in blood pressure. Check with your doctor for instructions on monitoring your blood pressure while taking this medicine. You may get drowsy or dizzy. Do not drive, use machinery, or do anything that needs mental alertness until you know how this medicine affects you. Do not stand or sit up quickly, especially if you are an older patient. This reduces the risk of dizzy or fainting spells. Alcohol may interfere with the effect of this medicine. Avoid alcoholic drinks. Your mouth may get dry. Chewing sugarless gum, sucking hard candy and drinking plenty of water will help. Contact your doctor if the problem does not go away or is severe. What side effects may I notice from receiving this medicine? Side effects that you should report to your doctor or health care professional as soon as possible: -allergic reactions like skin rash, itching or hives, swelling of the face, lips, or tongue -anxious -breathing problems -confusion -changes in vision -chest pain -confusion -elevated mood, decreased  need for sleep, racing thoughts, impulsive behavior -eye pain -fast, irregular heartbeat -feeling faint or lightheaded, falls -feeling agitated, angry, or irritable -hallucination, loss of contact with reality -high blood pressure -loss of balance or coordination -palpitations -redness, blistering, peeling or loosening of the skin, including inside the mouth -restlessness, pacing, inability to keep still -seizures -stiff muscles -suicidal thoughts or other mood changes -trouble passing urine or change in the amount of urine -trouble sleeping -unusual bleeding or bruising -unusually weak or tired -vomiting Side effects that usually do not require medical attention (report to your doctor or health care professional if they continue or are bothersome): -change in sex drive or performance -change in appetite or weight -constipation -dizziness -dry mouth -headache -increased sweating -nausea -tired This list may not describe all possible side effects. Call your doctor for medical advice about side effects. You may report side effects to FDA at 1-800-FDA-1088. Where should I keep my medicine? Keep out of the reach of children. Store at a controlled temperature between 20 and 25 degrees C (68 degrees and 77 degrees F), in a dry place. Throw away any unused medicine after the expiration date. NOTE: This   sheet is a summary. It may not cover all possible information. If you have questions about this medicine, talk to your doctor, pharmacist, or health care provider.  2018 [employer] Standard (2015-06-09 18:38:02)  
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
## 2017-01-17 ENCOUNTER — Encounter: Payer: Self-pay | Admitting: Student in an Organized Health Care Education/Training Program

## 2017-01-17 ENCOUNTER — Ambulatory Visit
Payer: Medicare HMO | Attending: Student in an Organized Health Care Education/Training Program | Admitting: Student in an Organized Health Care Education/Training Program

## 2017-01-17 ENCOUNTER — Ambulatory Visit: Payer: Medicare HMO | Attending: Neurology

## 2017-01-17 VITALS — BP 113/70 | HR 95 | Temp 98.4°F | Resp 16 | Ht 65.0 in | Wt 200.0 lb

## 2017-01-17 DIAGNOSIS — F419 Anxiety disorder, unspecified: Secondary | ICD-10-CM | POA: Insufficient documentation

## 2017-01-17 DIAGNOSIS — E781 Pure hyperglyceridemia: Secondary | ICD-10-CM | POA: Diagnosis not present

## 2017-01-17 DIAGNOSIS — E119 Type 2 diabetes mellitus without complications: Secondary | ICD-10-CM | POA: Insufficient documentation

## 2017-01-17 DIAGNOSIS — M79605 Pain in left leg: Secondary | ICD-10-CM | POA: Diagnosis present

## 2017-01-17 DIAGNOSIS — I7300 Raynaud's syndrome without gangrene: Secondary | ICD-10-CM | POA: Insufficient documentation

## 2017-01-17 DIAGNOSIS — E785 Hyperlipidemia, unspecified: Secondary | ICD-10-CM | POA: Diagnosis not present

## 2017-01-17 DIAGNOSIS — F329 Major depressive disorder, single episode, unspecified: Secondary | ICD-10-CM | POA: Insufficient documentation

## 2017-01-17 DIAGNOSIS — E538 Deficiency of other specified B group vitamins: Secondary | ICD-10-CM | POA: Insufficient documentation

## 2017-01-17 DIAGNOSIS — E559 Vitamin D deficiency, unspecified: Secondary | ICD-10-CM | POA: Insufficient documentation

## 2017-01-17 DIAGNOSIS — G7100 Muscular dystrophy, unspecified: Secondary | ICD-10-CM | POA: Diagnosis not present

## 2017-01-17 DIAGNOSIS — Z7984 Long term (current) use of oral hypoglycemic drugs: Secondary | ICD-10-CM | POA: Insufficient documentation

## 2017-01-17 DIAGNOSIS — G894 Chronic pain syndrome: Secondary | ICD-10-CM | POA: Diagnosis not present

## 2017-01-17 DIAGNOSIS — N83201 Unspecified ovarian cyst, right side: Secondary | ICD-10-CM | POA: Insufficient documentation

## 2017-01-17 DIAGNOSIS — Z7982 Long term (current) use of aspirin: Secondary | ICD-10-CM | POA: Diagnosis not present

## 2017-01-17 DIAGNOSIS — G4733 Obstructive sleep apnea (adult) (pediatric): Secondary | ICD-10-CM | POA: Insufficient documentation

## 2017-01-17 DIAGNOSIS — Z87898 Personal history of other specified conditions: Secondary | ICD-10-CM | POA: Diagnosis not present

## 2017-01-17 DIAGNOSIS — Z79899 Other long term (current) drug therapy: Secondary | ICD-10-CM | POA: Diagnosis not present

## 2017-01-17 DIAGNOSIS — R0683 Snoring: Secondary | ICD-10-CM | POA: Insufficient documentation

## 2017-01-17 DIAGNOSIS — F1410 Cocaine abuse, uncomplicated: Secondary | ICD-10-CM | POA: Insufficient documentation

## 2017-01-17 DIAGNOSIS — G43909 Migraine, unspecified, not intractable, without status migrainosus: Secondary | ICD-10-CM | POA: Diagnosis not present

## 2017-01-17 DIAGNOSIS — Z3049 Encounter for surveillance of other contraceptives: Secondary | ICD-10-CM | POA: Diagnosis not present

## 2017-01-17 DIAGNOSIS — G8929 Other chronic pain: Secondary | ICD-10-CM | POA: Diagnosis not present

## 2017-01-17 DIAGNOSIS — R51 Headache: Secondary | ICD-10-CM | POA: Diagnosis present

## 2017-01-17 DIAGNOSIS — M545 Low back pain: Secondary | ICD-10-CM | POA: Insufficient documentation

## 2017-01-17 DIAGNOSIS — N261 Atrophy of kidney (terminal): Secondary | ICD-10-CM | POA: Insufficient documentation

## 2017-01-17 DIAGNOSIS — F1411 Cocaine abuse, in remission: Secondary | ICD-10-CM

## 2017-01-17 DIAGNOSIS — K859 Acute pancreatitis without necrosis or infection, unspecified: Secondary | ICD-10-CM | POA: Insufficient documentation

## 2017-01-17 DIAGNOSIS — G4700 Insomnia, unspecified: Secondary | ICD-10-CM | POA: Insufficient documentation

## 2017-01-17 DIAGNOSIS — M791 Myalgia, unspecified site: Secondary | ICD-10-CM | POA: Diagnosis not present

## 2017-01-17 MED ORDER — PREGABALIN 100 MG PO CAPS: ORAL_CAPSULE | ORAL | 3 refills | Status: DC

## 2017-01-17 NOTE — Progress Notes (Signed)
Nursing Pain Medication Assessment:  Safety precautions to be maintained throughout the outpatient stay will include: orient to surroundings, keep bed in low position, maintain call bell within reach at all times, provide assistance with transfer out of bed and ambulation.  Medication Inspection Compliance: Meredith Williams did not comply with our request to bring her pills to be counted. She was reminded that bringing the medication bottles, even when empty, is a requirement.  Medication: None brought in. Pill/Patch Count: None available to be counted. Bottle Appearance: No container available. Did not bring bottle(s) to appointment. Filled Date: N/A Last Medication intake:  patient takes Lyrica, asked to bring bottle to next visit

## 2017-01-17 NOTE — Patient Instructions (Addendum)
1. Continue Lyrica at current dose, Rx sent 2. Stop Topamax 1 tablet in the morning and 1 at night for 2-3 days, then 1 tablet for 2-3 days then off Prescription given with instructions to patient

## 2017-01-17 NOTE — Progress Notes (Signed)
Patient's Name: Meredith Williams  MRN: [id number]  Referring Provider: Dion Body, MD  DOB: 11/10/1980  PCP: Dion Body, MD  DOS: 01/17/2017  Note by: Gillis Santa, MD  Service setting: Ambulatory outpatient  Specialty: Interventional Pain Management  Location: [facility] (AMB) Pain Management Facility    Patient type: Established   Primary Reason(s) for Visit: Encounter for prescription drug management. (Level of risk: moderate)  CC: Leg Pain (left) and Headache (miagraine that started Christmas day)  HPI  Meredith Williams is a 36 y.o. year old, female patient, who comes today for a medication management evaluation. She has Acute pancreatitis; Pancreatitis; Insomnia; Hypertriglyceridemia; Encounter for therapeutic drug monitoring; Diabetes mellitus without complication ([place]); Depression; Chronic pain syndrome; Anxiety; Cocaine use; Intractable chronic migraine without aura and without status migrainosus; Raynaud phenomenon; Primary osteoarthritis of right hip; Myotonic muscular dystrophy ([place]); Raynaud's syndrome without gangrene; Vitamin D deficiency; Vitamin B12 deficiency; and Right hip impingement syndrome on their problem list. Her primarily concern today is the Leg Pain (left) and Headache (miagraine that started Christmas day)  Pain Assessment: Location: Left Leg(headache) Radiating: na Onset: More than a month ago Duration: Chronic pain Quality: Numbness, Discomfort(leg pain is only happening at night, feels like it doesn't want to work) Severity: 8 /10 (self-reported pain score)  Note: Reported level is inconsistent with clinical observations. Clinically the patient looks like a 3/10             When using our objective Pain Scale, levels between 6 and 10/10 are said to belong in an emergency room, as it progressively worsens from a 6/10, described as severely limiting, requiring emergency care not usually available at an outpatient pain management facility. At a 6/10 level, communication  becomes difficult and requires great effort. Assistance to reach the emergency department may be required. Facial flushing and profuse sweating along with potentially dangerous increases in heart rate and blood pressure will be evident. Effect on ADL: patient is having to sit around a lot d/t leg pain Timing: Intermittent Modifying factors: rest or not being on it  Meredith Williams was last scheduled for an appointment on 12/25/2016 for medication management. During today's appointment we reviewed Meredith Williams's chronic pain status, as well as her outpatient medication regimen.  Patient returns today for follow-up.  States that she is tolerating Lyrica and is finding some benefit from it in regards to her chronic pain syndrome.  She takes 100 mg in the morning and 200 mg nightly.  She is on Topamax 100 mg twice daily and finds no benefit from this medication.  She is continuing to endorse migraines with sensitivity to light and noise greater than 14 days out of the month.  The patient  reports that she does not use drugs. Her body mass index is 33.28 kg/m.  Further details on both, my assessment(s), as well as the proposed treatment plan, please see below.   Laboratory Chemistry  Inflammation Markers (CRP: Acute Phase) (ESR: Chronic Phase) Lab Results  Component Value Date   LATICACIDVEN 0.9 06/15/2016                 Rheumatology Markers No results found for: RF, ANA, [facility], LYMEIGGIGMAB, [facility]              Renal Function Markers Lab Results  Component Value Date   BUN 17 11/13/2016   CREATININE 1.06 (H) 11/13/2016   GFRAA >60 11/13/2016   GFRNONAA >60 11/13/2016  Hepatic Function Markers Lab Results  Component Value Date   AST 25 11/13/2016   ALT 31 11/13/2016   ALBUMIN 4.4 11/13/2016   ALKPHOS 38 11/13/2016   LIPASE 25 11/13/2016                 Electrolytes Lab Results  Component Value Date   NA 136 11/13/2016   K 5.2 (H) 11/13/2016   CL 99 (L)  11/13/2016   CALCIUM 9.6 11/13/2016   MG 2.4 06/22/2016   PHOS 2.7 06/22/2016                 Neuropathy Markers Lab Results  Component Value Date   HIV Non Reactive 06/16/2016                 Bone Pathology Markers No results found for: [place], [id number], [id number], [id number], 25OHVITD1, 25OHVITD2, 25OHVITD3, TESTOFREE, TESTOSTERONE               Coagulation Parameters Lab Results  Component Value Date   PLT 382 11/13/2016                 Cardiovascular Markers Lab Results  Component Value Date   TROPONINI <0.03 06/15/2016   HGB 16.3 (H) 11/13/2016   HCT 48.9 (H) 11/13/2016                 CA Markers No results found for: CEA, CA125, LABCA2               Note: Lab results reviewed.  Recent Diagnostic Imaging Results  CT ABDOMEN PELVIS W CONTRAST CLINICAL DATA:  Severe mid abdominal pain, nausea and vomiting since 11/15/2016.  EXAM: CT ABDOMEN AND PELVIS WITH CONTRAST  TECHNIQUE: Multidetector CT imaging of the abdomen and pelvis was performed using the standard protocol following bolus administration of intravenous contrast.  CONTRAST:  100 cc Isovue 370 IV  COMPARISON:  06/23/2016  FINDINGS: Lower chest: Normal size heart without pericardial effusion. Linear platelike atelectasis and/or scarring within each lower lobe with subsegmental atelectasis in the lingula and left lower lobe as well. No dominant mass, pneumonic consolidation or effusion.  Hepatobiliary: Hypodense appearance of the liver consistent with hepatic steatosis. No space-occupying mass. Normal gallbladder without stones. No biliary dilatation.  Pancreas: Mild fatty infiltration of the pancreatic head and uncinate process. No dominant mass or ductal dilatation. No inflammation.  Spleen: Normal  Adrenals/Urinary Tract: Normal bilateral adrenal glands. Atrophic left kidney with compensatory hypertrophy of the right. Retroaortic left renal vein. No obstructive uropathy. Decompressed  urinary bladder without focal mural thickening or calculus.  Stomach/Bowel: Go physiologic distention of the stomach with enteric contrast. There is normal small bowel rotation and ligament of Treitz position. No small bowel obstruction or inflammation. Normal-appearing appendix. An average amount of stool is noted within the colon. No acute large bowel abnormality.  Vascular/Lymphatic: No aortic aneurysm or dissection. No lymphadenopathy.  Reproductive: Status post hysterectomy. Peripherally enhancing 19 mm ruptured cyst or follicle of the right ovary with associated small amount of free fluid in the cul-de-sac.  Other: No abdominal wall hernia.  Musculoskeletal: No acute or significant osseous findings.  IMPRESSION: 1. Irregular peripherally enhancing 19 mm right ovarian cyst or follicle with associated free fluid in the cul-de-sac. Findings may reflect a ruptured/hemorrhagic cyst or corpus luteum. 2. Atrophic left kidney with compensatory hypertrophy of the right kidney as before. No obstructive uropathy. 3. No acute bowel obstruction or inflammation. 4. Unremarkable appearance of the pancreas with resolved peripancreatic inflammation  since prior exam. No pseudocyst formation or ductal dilatation. It should be noted that the pancreas can appear normal on CT despite clinical pancreatitis and therefore laboratory correlation is recommended.  Electronically Signed   By: Ashley Royalty M.D.   On: 11/20/2016 14:35  Complexity Note: Imaging results reviewed. Results shared with Ms. Knopf, using Layman's terms.                         Meds   Current Outpatient Medications:  .  aspirin EC 81 MG tablet, Take 81 mg by mouth daily., Disp: , Rfl:  .  cyclobenzaprine (FLEXERIL) 10 MG tablet, Take 15 mg by mouth 2 (two) times daily as needed for muscle spasms., Disp: , Rfl: 0 .  fenofibrate 160 MG tablet, Take 160 mg by mouth daily., Disp: , Rfl: 0 .  hydrOXYzine (VISTARIL) 25 MG  capsule, Take 1 capsule (25 mg total) by mouth 3 (three) times daily as needed for anxiety., Disp: 90 capsule, Rfl: 1 .  ibuprofen (ADVIL,MOTRIN) 800 MG tablet, Take 800 mg by mouth every 8 (eight) hours as needed., Disp: , Rfl:  .  JUNEL FE 24 1-20 MG-MCG(24) tablet, Take 1 tablet by mouth daily., Disp: , Rfl: 0 .  metFORMIN (GLUCOPHAGE) 500 MG tablet, Take 500 mg by mouth daily., Disp: , Rfl:  .  pregabalin (LYRICA) 100 MG capsule, 100 mg in the qAM, 200 mg qPM, Disp: 90 capsule, Rfl: 3 .  venlafaxine XR (EFFEXOR-XR) 37.5 MG 24 hr capsule, Take 1 capsule (37.5 mg total) by mouth daily with breakfast., Disp: 30 capsule, Rfl: 1 .  zolpidem (AMBIEN) 5 MG tablet, Take 1 tablet (5 mg total) by mouth at bedtime as needed for sleep., Disp: 30 tablet, Rfl: 1  ROS  Constitutional: Denies any fever or chills Gastrointestinal: No reported hemesis, hematochezia, vomiting, or acute GI distress Musculoskeletal: Denies any acute onset joint swelling, redness, loss of ROM, or weakness Neurological: No reported episodes of acute onset apraxia, aphasia, dysarthria, agnosia, amnesia, paralysis, loss of coordination, or loss of consciousness  Allergies  Ms. Senn is allergic to other and tape.  PFSH  Drug: Ms. Fickel  reports that she does not use drugs. Alcohol:  reports that she does not drink alcohol. Tobacco:  reports that she is a non-smoker but has been exposed to tobacco smoke. she has never used smokeless tobacco. Medical:  has a past medical history of Anxiety, Depression, Diabetes mellitus without complication ([place]), Hyperlipidemia, Muscular dystrophy, myotonic ([place]), and Pancreatitis. Surgical: Ms. Bendix  has a past surgical history that includes Abdominal hysterectomy and Hip surgery (Right, 2016). Family: family history includes Anxiety disorder in her sister; Depression in her sister and sister; Drug abuse in her brother; Muscular dystrophy in her mother and sister; Stroke in her  mother.  Constitutional Exam  General appearance: Well nourished, well developed, and well hydrated. In no apparent acute distress Vitals:   01/17/17 0838  BP: 113/70  Pulse: 95  Resp: 16  Temp: 98.4 F (36.9 C)  TempSrc: Oral  SpO2: 97%  Weight: 200 lb (90.7 kg)  Height: _0  (1.651 m)   BMI Assessment: Estimated body mass index is 33.28 kg/m as calculated from the following:   Height as of this encounter: _1  (1.651 m).   Weight as of this encounter: 200 lb (90.7 kg).  BMI interpretation table: BMI level Category Range association with higher incidence of chronic pain  <18 kg/m2 Underweight  18.5-24.9 kg/m2 Ideal body weight   25-29.9 kg/m2 Overweight Increased incidence by 20%  30-34.9 kg/m2 Obese (Class I) Increased incidence by 68%  35-39.9 kg/m2 Severe obesity (Class II) Increased incidence by 136%  >40 kg/m2 Extreme obesity (Class III) Increased incidence by 254%   BMI Readings from Last 4 Encounters:  01/17/17 33.28 kg/m  01/04/17 33.15 kg/m  12/25/16 33.28 kg/m  11/13/16 33.89 kg/m   Wt Readings from Last 4 Encounters:  01/17/17 200 lb (90.7 kg)  01/04/17 199 lb 3.2 oz (90.4 kg)  12/25/16 200 lb (90.7 kg)  11/13/16 210 lb (95.3 kg)  Psych/Mental status: Alert, oriented x 3 (person, place, & time)       Eyes: PERLA Respiratory: No evidence of acute respiratory distress  Cervical Spine Area Exam  Skin & Axial Inspection: No masses, redness, edema, swelling, or associated skin lesions Alignment: Symmetrical Functional ROM: Unrestricted ROM      Stability: No instability detected Muscle Tone/Strength: Functionally intact. No obvious neuro-muscular anomalies detected. Sensory (Neurological): Unimpaired Palpation: No palpable anomalies              Upper Extremity (UE) Exam    Side: Right upper extremity  Side: Left upper extremity  Skin & Extremity Inspection: Skin color, temperature, and hair growth are WNL. No peripheral edema or cyanosis. No  masses, redness, swelling, asymmetry, or associated skin lesions. No contractures.  Skin & Extremity Inspection: Skin color, temperature, and hair growth are WNL. No peripheral edema or cyanosis. No masses, redness, swelling, asymmetry, or associated skin lesions. No contractures.  Functional ROM: Unrestricted ROM          Functional ROM: Unrestricted ROM          Muscle Tone/Strength: Functionally intact. No obvious neuro-muscular anomalies detected.  Muscle Tone/Strength: Functionally intact. No obvious neuro-muscular anomalies detected.  Sensory (Neurological): Unimpaired          Sensory (Neurological): Unimpaired          Palpation: No palpable anomalies              Palpation: No palpable anomalies              Specialized Test(s): Deferred         Specialized Test(s): Deferred          Thoracic Spine Area Exam  Skin & Axial Inspection: No masses, redness, or swelling Alignment: Symmetrical Functional ROM: Unrestricted ROM Stability: No instability detected Muscle Tone/Strength: Functionally intact. No obvious neuro-muscular anomalies detected. Sensory (Neurological): Unimpaired Muscle strength & Tone: No palpable anomalies  Lumbar Spine Area Exam  Skin & Axial Inspection: No masses, redness, or swelling Alignment: Symmetrical Functional ROM: Unrestricted ROM      Stability: No instability detected Muscle Tone/Strength: Functionally intact. No obvious neuro-muscular anomalies detected. Sensory (Neurological): Unimpaired Palpation: No palpable anomalies       Provocative Tests: Lumbar Hyperextension and rotation test: evaluation deferred today       Lumbar Lateral bending test: evaluation deferred today       Patrick's Maneuver: evaluation deferred today                    Gait & Posture Assessment  Ambulation: Unassisted Gait: Relatively normal for age and body habitus Posture: WNL   Lower Extremity Exam    Side: Right lower extremity  Side: Left lower extremity  Skin &  Extremity Inspection: Skin color, temperature, and hair growth are WNL. No peripheral edema or cyanosis. No masses, redness,  swelling, asymmetry, or associated skin lesions. No contractures.  Skin & Extremity Inspection: Skin color, temperature, and hair growth are WNL. No peripheral edema or cyanosis. No masses, redness, swelling, asymmetry, or associated skin lesions. No contractures.  Functional ROM: Unrestricted ROM          Functional ROM: Unrestricted ROM          Muscle Tone/Strength: Functionally intact. No obvious neuro-muscular anomalies detected.  Muscle Tone/Strength: Functionally intact. No obvious neuro-muscular anomalies detected.  Sensory (Neurological): Unimpaired  Sensory (Neurological): Unimpaired  Palpation: No palpable anomalies  Palpation: No palpable anomalies   Assessment  Primary Diagnosis & Pertinent Problem List: The primary encounter diagnosis was Chronic pain syndrome. Diagnoses of Muscular dystrophy, Chronic bilateral low back pain without sciatica, Myalgia, and Hx of cocaine abuse were also pertinent to this visit.  Status Diagnosis  Controlled Controlled Controlled 1. Chronic pain syndrome   2. Muscular dystrophy   3. Chronic bilateral low back pain without sciatica   4. Myalgia   5. Hx of cocaine abuse      36 year old female with prominent psychiatric history and history of substance abuse (including alcohol and cocaine) who presents with diffuse myalgias secondary to muscular dystrophy. Patient also endorses migraines for which she receives sphenopalatine ganglion nerve blocks with her neurologist. Patient is high risk for opioid prescribing. The patient will not be a candidate for opioid therapy through this clinic. Patient will be optimized on non-opioid adjunctive analgesics and a comprehensive pain management plan was developed.  Patient returns today for follow-up.  Since our last appointment December 4 of 2018, patient has had a trigger point injection  in the right rectus muscle by her OB/GYN doctor.  She is also continuing to see her psychiatrist who was started her on Effexor 37.5 mg.  Patient finds no benefit since starting Topamax and after increasing it.  She continues to have migraines.  To reduce her pill burden and the fact that she is not obtaining any benefit, I will have her wean off of Topamax over the course of 4 days.  Instructions have been provided.  I have instructed the patient to continue her Lyrica as prescribed, 100 mg in the morning, 20 mill grams nightly..  Lyrica was refilled.  Follow-up in 3 months.    Plan of Care  Pharmacotherapy (Medications Ordered): Meds ordered this encounter  Medications  . pregabalin (LYRICA) 100 MG capsule    Sig: 100 mg in the qAM, 200 mg qPM    Dispense:  90 capsule    Refill:  3    Do not place this medication, or any other prescription from our practice, on "Automatic Refill". Patient may have prescription filled one day early if pharmacy is closed on scheduled refill date.   Provider-requested follow-up: Return in about 3 months (around 04/17/2017) for Medication Management.  Future Appointments  Date Time Provider Terminous  01/25/2017 10:30 AM Ursula Alert, MD [facility] None  04/11/2017  8:00 AM Gillis Santa, MD [facility] None    Primary Care Physician: Dion Body, MD Location: [facility] Note by: Gillis Santa, M.D Date: 01/17/2017; Time: 9:12 AM  Patient Instructions  1. Continue Lyrica at current dose, Rx sent 2. Stop Topamax 1 tablet in the morning and 1 at night for 2-3 days, then 1 tablet for 2-3 days then off Prescription given with instructions to patient

## 2017-01-17 NOTE — Progress Notes (Signed)
Safety precautions to be maintained throughout the outpatient stay will include: orient to surroundings, keep bed in low position, maintain call bell within reach at all times, provide assistance with transfer out of bed and ambulation.  

## 2017-01-25 ENCOUNTER — Encounter: Payer: Self-pay | Admitting: Psychiatry

## 2017-01-25 ENCOUNTER — Other Ambulatory Visit: Payer: Self-pay

## 2017-01-25 ENCOUNTER — Ambulatory Visit (INDEPENDENT_AMBULATORY_CARE_PROVIDER_SITE_OTHER): Payer: Medicare HMO | Admitting: Psychiatry

## 2017-01-25 VITALS — BP 115/82 | HR 98 | Temp 98.4°F | Wt 205.4 lb

## 2017-01-25 DIAGNOSIS — F411 Generalized anxiety disorder: Secondary | ICD-10-CM | POA: Diagnosis not present

## 2017-01-25 DIAGNOSIS — F331 Major depressive disorder, recurrent, moderate: Secondary | ICD-10-CM | POA: Diagnosis not present

## 2017-01-25 DIAGNOSIS — F5105 Insomnia due to other mental disorder: Secondary | ICD-10-CM

## 2017-01-25 DIAGNOSIS — F4010 Social phobia, unspecified: Secondary | ICD-10-CM | POA: Diagnosis not present
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
## 2017-02-12 ENCOUNTER — Ambulatory Visit (INDEPENDENT_AMBULATORY_CARE_PROVIDER_SITE_OTHER): Payer: Medicare HMO | Admitting: Licensed Clinical Social Worker

## 2017-02-12 DIAGNOSIS — F331 Major depressive disorder, recurrent, moderate: Secondary | ICD-10-CM | POA: Diagnosis not present

## 2017-02-12 DIAGNOSIS — F411 Generalized anxiety disorder: Secondary | ICD-10-CM
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
## 2017-02-14 DIAGNOSIS — E538 Deficiency of other specified B group vitamins: Secondary | ICD-10-CM | POA: Diagnosis not present

## 2017-02-14 DIAGNOSIS — G894 Chronic pain syndrome: Secondary | ICD-10-CM | POA: Diagnosis not present

## 2017-02-14 DIAGNOSIS — G43719 Chronic migraine without aura, intractable, without status migrainosus: Secondary | ICD-10-CM | POA: Diagnosis not present

## 2017-02-14 DIAGNOSIS — G7111 Myotonic muscular dystrophy: Secondary | ICD-10-CM | POA: Diagnosis not present

## 2017-02-14 DIAGNOSIS — G4733 Obstructive sleep apnea (adult) (pediatric): Secondary | ICD-10-CM | POA: Diagnosis not present

## 2017-02-27 DIAGNOSIS — J40 Bronchitis, not specified as acute or chronic: Secondary | ICD-10-CM | POA: Diagnosis not present

## 2017-02-27 DIAGNOSIS — J069 Acute upper respiratory infection, unspecified: Secondary | ICD-10-CM | POA: Diagnosis not present

## 2017-02-28 ENCOUNTER — Ambulatory Visit (INDEPENDENT_AMBULATORY_CARE_PROVIDER_SITE_OTHER): Payer: Medicare HMO | Admitting: Psychiatry

## 2017-02-28 ENCOUNTER — Encounter: Payer: Self-pay | Admitting: Psychiatry

## 2017-02-28 ENCOUNTER — Other Ambulatory Visit: Payer: Self-pay

## 2017-02-28 VITALS — BP 139/87 | HR 125 | Temp 98.2°F | Wt 213.2 lb

## 2017-02-28 DIAGNOSIS — F4010 Social phobia, unspecified: Secondary | ICD-10-CM

## 2017-02-28 DIAGNOSIS — F411 Generalized anxiety disorder: Secondary | ICD-10-CM

## 2017-02-28 DIAGNOSIS — F5105 Insomnia due to other mental disorder: Secondary | ICD-10-CM

## 2017-02-28 DIAGNOSIS — F331 Major depressive disorder, recurrent, moderate: Secondary | ICD-10-CM | POA: Diagnosis not present

## 2017-02-28 MED ORDER — ZOLPIDEM TARTRATE 5 MG PO TABS: 5.0000 mg | ORAL_TABLET | Freq: Every evening | ORAL | 0 refills | Status: DC | PRN

## 2017-02-28 MED ORDER — VENLAFAXINE HCL ER 37.5 MG PO CP24: 37.5000 mg | ORAL_CAPSULE | Freq: Every day | ORAL | 0 refills | Status: DC

## 2017-02-28 MED ORDER — HYDROXYZINE PAMOATE 25 MG PO CAPS: 25.0000 mg | ORAL_CAPSULE | Freq: Three times a day (TID) | ORAL | 0 refills | Status: DC | PRN
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
## 2017-03-01 ENCOUNTER — Encounter: Payer: Self-pay | Admitting: Psychiatry

## 2017-03-05 ENCOUNTER — Ambulatory Visit: Payer: Medicare HMO | Admitting: Psychiatry

## 2017-03-14 DIAGNOSIS — H6692 Otitis media, unspecified, left ear: Secondary | ICD-10-CM | POA: Diagnosis not present

## 2017-03-22 DIAGNOSIS — E781 Pure hyperglyceridemia: Secondary | ICD-10-CM | POA: Diagnosis not present

## 2017-03-22 DIAGNOSIS — E119 Type 2 diabetes mellitus without complications: Secondary | ICD-10-CM | POA: Diagnosis not present

## 2017-03-26 ENCOUNTER — Ambulatory Visit: Payer: Medicare HMO | Admitting: Licensed Clinical Social Worker

## 2017-04-09 DIAGNOSIS — E119 Type 2 diabetes mellitus without complications: Secondary | ICD-10-CM | POA: Diagnosis not present

## 2017-04-09 DIAGNOSIS — E781 Pure hyperglyceridemia: Secondary | ICD-10-CM | POA: Diagnosis not present

## 2017-04-09 DIAGNOSIS — E669 Obesity, unspecified: Secondary | ICD-10-CM | POA: Diagnosis not present

## 2017-04-11 ENCOUNTER — Other Ambulatory Visit: Payer: Self-pay

## 2017-04-11 ENCOUNTER — Ambulatory Visit
Payer: Medicare HMO | Attending: Student in an Organized Health Care Education/Training Program | Admitting: Student in an Organized Health Care Education/Training Program

## 2017-04-11 ENCOUNTER — Encounter: Payer: Self-pay | Admitting: Student in an Organized Health Care Education/Training Program

## 2017-04-11 VITALS — BP 144/95 | HR 116 | Temp 98.1°F | Resp 18 | Ht 66.0 in | Wt 220.0 lb

## 2017-04-11 DIAGNOSIS — Z7982 Long term (current) use of aspirin: Secondary | ICD-10-CM | POA: Insufficient documentation

## 2017-04-11 DIAGNOSIS — M545 Low back pain, unspecified: Secondary | ICD-10-CM | POA: Insufficient documentation

## 2017-04-11 DIAGNOSIS — F329 Major depressive disorder, single episode, unspecified: Secondary | ICD-10-CM | POA: Insufficient documentation

## 2017-04-11 DIAGNOSIS — M791 Myalgia, unspecified site: Secondary | ICD-10-CM | POA: Diagnosis not present

## 2017-04-11 DIAGNOSIS — Z87898 Personal history of other specified conditions: Secondary | ICD-10-CM | POA: Diagnosis not present

## 2017-04-11 DIAGNOSIS — G7100 Muscular dystrophy, unspecified: Secondary | ICD-10-CM | POA: Insufficient documentation

## 2017-04-11 DIAGNOSIS — Z7984 Long term (current) use of oral hypoglycemic drugs: Secondary | ICD-10-CM | POA: Diagnosis not present

## 2017-04-11 DIAGNOSIS — M542 Cervicalgia: Secondary | ICD-10-CM | POA: Diagnosis not present

## 2017-04-11 DIAGNOSIS — F1411 Cocaine abuse, in remission: Secondary | ICD-10-CM

## 2017-04-11 DIAGNOSIS — Z5181 Encounter for therapeutic drug level monitoring: Secondary | ICD-10-CM | POA: Diagnosis not present

## 2017-04-11 DIAGNOSIS — F419 Anxiety disorder, unspecified: Secondary | ICD-10-CM | POA: Diagnosis not present

## 2017-04-11 DIAGNOSIS — F1410 Cocaine abuse, uncomplicated: Secondary | ICD-10-CM | POA: Diagnosis not present

## 2017-04-11 DIAGNOSIS — Z79899 Other long term (current) drug therapy: Secondary | ICD-10-CM | POA: Diagnosis not present

## 2017-04-11 DIAGNOSIS — E119 Type 2 diabetes mellitus without complications: Secondary | ICD-10-CM | POA: Diagnosis not present

## 2017-04-11 DIAGNOSIS — E785 Hyperlipidemia, unspecified: Secondary | ICD-10-CM | POA: Diagnosis not present

## 2017-04-11 DIAGNOSIS — G8929 Other chronic pain: Secondary | ICD-10-CM | POA: Diagnosis not present

## 2017-04-11 DIAGNOSIS — G894 Chronic pain syndrome: Secondary | ICD-10-CM | POA: Diagnosis not present

## 2017-04-11 MED ORDER — PREGABALIN 100 MG PO CAPS: ORAL_CAPSULE | ORAL | 4 refills | Status: DC

## 2017-04-11 NOTE — Patient Instructions (Signed)
You were given one prescription for Lyrica today. 

## 2017-04-11 NOTE — Progress Notes (Signed)
Patient's Name: Meredith Williams  MRN: [id number]  Referring Provider: Dion Body, MD  DOB: Feb 27, 1980  PCP: Dion Body, MD  DOS: 04/11/2017  Note by: Gillis Santa, MD  Service setting: Ambulatory outpatient  Specialty: Interventional Pain Management  Location: [facility] (AMB) Pain Management Facility    Patient type: Established   Primary Reason(s) for Visit: Encounter for prescription drug management. (Level of risk: moderate)  CC: Back Pain (lower) and Neck Pain  HPI  Meredith Williams is a 37 y.o. year old, female patient, who comes today for a medication management evaluation. She has Acute pancreatitis; Pancreatitis; Insomnia; Hypertriglyceridemia; Encounter for therapeutic drug monitoring; Diabetes mellitus without complication ([place]); Depression; Chronic pain syndrome; Anxiety; Cocaine use; Intractable chronic migraine without aura and without status migrainosus; Raynaud phenomenon; Primary osteoarthritis of right hip; Myotonic muscular dystrophy ([place]); Raynaud's syndrome without gangrene; Vitamin D deficiency; Vitamin B12 deficiency; Right hip impingement syndrome; Muscular dystrophy; Chronic bilateral low back pain without sciatica; and Myalgia on their problem list. Her primarily concern today is the Back Pain (lower) and Neck Pain  Pain Assessment: Location: Lower Back Radiating: entire right leg Onset: More than a month ago Duration: Chronic pain Quality: Tingling Severity: 8 /10 (self-reported pain score)  Note: Reported level is inconsistent with clinical observations.                         When using our objective Pain Scale, levels between 6 and 10/10 are said to belong in an emergency room, as it progressively worsens from a 6/10, described as severely limiting, requiring emergency care not usually available at an outpatient pain management facility. At a 6/10 level, communication becomes difficult and requires great effort. Assistance to reach the emergency department may be  required. Facial flushing and profuse sweating along with potentially dangerous increases in heart rate and blood pressure will be evident. Effect on ADL:   Timing: Constant Modifying factors: Lyrica  [name] was last scheduled for an appointment on 01/17/2017 for medication management. During today's appointment we reviewed Meredith Williams's chronic pain status, as well as her outpatient medication regimen.  The patient  reports that she does not use drugs. Her body mass index is 35.51 kg/m.  Further details on both, my assessment(s), as well as the proposed treatment plan, please see below.  Patient returns for Lyrica refill. Doing well overall until about 3 days ago when she ran out of Lyrica. Notes worsening neck and back pain since then.  Laboratory Chemistry  Inflammation Markers (CRP: Acute Phase) (ESR: Chronic Phase) Lab Results  Component Value Date   LATICACIDVEN 0.9 06/15/2016                         Rheumatology Markers No results found for: RF, ANA, Therisa D[place], [facility]              Renal Function Markers Lab Results  Component Value Date   BUN 17 11/13/2016   CREATININE 1.06 (H) 11/13/2016   GFRAA >60 11/13/2016   GFRNONAA >60 11/13/2016                 Hepatic Function Markers Lab Results  Component Value Date   AST 25 11/13/2016   ALT 31 11/13/2016   ALBUMIN 4.4 11/13/2016   ALKPHOS 38 11/13/2016   LIPASE 25 11/13/2016                 Electrolytes Lab Results  Component Value Date   NA 136 11/13/2016   K 5.2 (H) 11/13/2016   CL 99 (L) 11/13/2016   CALCIUM 9.6 11/13/2016   MG 2.4 06/22/2016   PHOS 2.7 06/22/2016                        Neuropathy Markers Lab Results  Component Value Date   HIV Non Reactive 06/16/2016                 Bone Pathology Markers No results found for: [place], [id number], [id number], [id number], 25OHVITD1, 25OHVITD2, 25OHVITD3, TESTOFREE, TESTOSTERONE                       Coagulation Parameters Lab  Results  Component Value Date   PLT 382 11/13/2016                 Cardiovascular Markers Lab Results  Component Value Date   TROPONINI <0.03 06/15/2016   HGB 16.3 (H) 11/13/2016   HCT 48.9 (H) 11/13/2016                 CA Markers No results found for: CEA, CA125, LABCA2               Note: Lab results reviewed.  Recent Diagnostic Imaging Results  CT ABDOMEN PELVIS W CONTRAST CLINICAL DATA:  Severe mid abdominal pain, nausea and vomiting since 11/15/2016.  EXAM: CT ABDOMEN AND PELVIS WITH CONTRAST  TECHNIQUE: Multidetector CT imaging of the abdomen and pelvis was performed using the standard protocol following bolus administration of intravenous contrast.  CONTRAST:  100 cc Isovue 370 IV  COMPARISON:  06/23/2016  FINDINGS: Lower chest: Normal size heart without pericardial effusion. Linear platelike atelectasis and/or scarring within each lower lobe with subsegmental atelectasis in the lingula and left lower lobe as well. No dominant mass, pneumonic consolidation or effusion.  Hepatobiliary: Hypodense appearance of the liver consistent with hepatic steatosis. No space-occupying mass. Normal gallbladder without stones. No biliary dilatation.  Pancreas: Mild fatty infiltration of the pancreatic head and uncinate process. No dominant mass or ductal dilatation. No inflammation.  Spleen: Normal  Adrenals/Urinary Tract: Normal bilateral adrenal glands. Atrophic left kidney with compensatory hypertrophy of the right. Retroaortic left renal vein. No obstructive uropathy. Decompressed urinary bladder without focal mural thickening or calculus.  Stomach/Bowel: Go physiologic distention of the stomach with enteric contrast. There is normal small bowel rotation and ligament of Treitz position. No small bowel obstruction or inflammation. Normal-appearing appendix. An average amount of stool is noted within the colon. No acute large bowel  abnormality.  Vascular/Lymphatic: No aortic aneurysm or dissection. No lymphadenopathy.  Reproductive: Status post hysterectomy. Peripherally enhancing 19 mm ruptured cyst or follicle of the right ovary with associated small amount of free fluid in the cul-de-sac.  Other: No abdominal wall hernia.  Musculoskeletal: No acute or significant osseous findings.  IMPRESSION: 1. Irregular peripherally enhancing 19 mm right ovarian cyst or follicle with associated free fluid in the cul-de-sac. Findings may reflect a ruptured/hemorrhagic cyst or corpus luteum. 2. Atrophic left kidney with compensatory hypertrophy of the right kidney as before. No obstructive uropathy. 3. No acute bowel obstruction or inflammation. 4. Unremarkable appearance of the pancreas with resolved peripancreatic inflammation since prior exam. No pseudocyst formation or ductal dilatation. It should be noted that the pancreas can appear normal on CT despite clinical pancreatitis and therefore laboratory correlation is recommended.  Electronically Signed   By: Ashley Royalty  M.D.   On: 11/20/2016 14:35  Complexity Note: Imaging results reviewed. Results shared with Ms. Goodpasture, using Layman's terms.                         Meds   Current Outpatient Medications:  .  aspirin EC 81 MG tablet, Take 81 mg by mouth daily., Disp: , Rfl:  .  Black Cohosh 40 MG CAPS, Take by mouth daily., Disp: , Rfl:  .  cyclobenzaprine (FLEXERIL) 10 MG tablet, Take 15 mg by mouth 2 (two) times daily as needed for muscle spasms., Disp: , Rfl: 0 .  fenofibrate 160 MG tablet, Take 160 mg by mouth daily., Disp: , Rfl: 0 .  hydrOXYzine (VISTARIL) 25 MG capsule, Take 1 capsule (25 mg total) by mouth 3 (three) times daily as needed for anxiety., Disp: 270 capsule, Rfl: 0 .  metFORMIN (GLUCOPHAGE) 500 MG tablet, Take 500 mg by mouth daily., Disp: , Rfl:  .  pregabalin (LYRICA) 100 MG capsule, 100 mg in the qAM, 200 mg qPM, Disp: 90 capsule, Rfl: 4 .   venlafaxine XR (EFFEXOR-XR) 37.5 MG 24 hr capsule, Take 1 capsule (37.5 mg total) by mouth daily with breakfast., Disp: 90 capsule, Rfl: 0 .  zolpidem (AMBIEN) 5 MG tablet, Take 1 tablet (5 mg total) by mouth at bedtime as needed for sleep., Disp: 90 tablet, Rfl: 0 .  ibuprofen (ADVIL,MOTRIN) 800 MG tablet, Take 800 mg by mouth every 8 (eight) hours as needed., Disp: , Rfl:  .  JUNEL FE 24 1-20 MG-MCG(24) tablet, Take 1 tablet by mouth daily., Disp: , Rfl: 0  ROS  Constitutional: Denies any fever or chills Gastrointestinal: No reported hemesis, hematochezia, vomiting, or acute GI distress Musculoskeletal: Denies any acute onset joint swelling, redness, loss of ROM, or weakness Neurological: No reported episodes of acute onset apraxia, aphasia, dysarthria, agnosia, amnesia, paralysis, loss of coordination, or loss of consciousness  Allergies  Ms. Segers is allergic to other.  PFSH  Drug: Ms. Brander  reports that she does not use drugs. Alcohol:  reports that she does not drink alcohol. Tobacco:  reports that she is a non-smoker but has been exposed to tobacco smoke. She has never used smokeless tobacco. Medical:  has a past medical history of Anxiety, Depression, Diabetes mellitus without complication ([place]), Hyperlipidemia, Muscular dystrophy, myotonic ([place]), and Pancreatitis. Surgical: Ms. Griebel  has a past surgical history that includes Abdominal hysterectomy and Hip surgery (Right, 2016). Family: family history includes Anxiety disorder in her sister; Depression in her sister and sister; Drug abuse in her brother; Muscular dystrophy in her mother and sister; Stroke in her mother.  Constitutional Exam  General appearance: Well nourished, well developed, and well hydrated. In no apparent acute distress Vitals:   04/11/17 0803  BP: (!) 144/95  Pulse: (!) 116  Resp: 18  Temp: 98.1 F (36.7 C)  TempSrc: Oral  SpO2: 96%  Weight: 220 lb (99.8 kg)  Height: '5\' 6"'  (1.676 m)   BMI Assessment:  Estimated body mass index is 35.51 kg/m as calculated from the following:   Height as of this encounter: '5\' 6"'  (1.676 m).   Weight as of this encounter: 220 lb (99.8 kg).  BMI interpretation table: BMI level Category Range association with higher incidence of chronic pain  <18 kg/m2 Underweight   18.5-24.9 kg/m2 Ideal body weight   25-29.9 kg/m2 Overweight Increased incidence by 20%  30-34.9 kg/m2 Obese (Class I) Increased incidence by 68%  35-39.9 kg/m2 Severe obesity (Class II) Increased incidence by 136%  >40 kg/m2 Extreme obesity (Class III) Increased incidence by 254%   BMI Readings from Last 4 Encounters:  04/11/17 35.51 kg/m  01/17/17 33.28 kg/m  12/25/16 33.28 kg/m  11/13/16 33.89 kg/m   Wt Readings from Last 4 Encounters:  04/11/17 220 lb (99.8 kg)  01/17/17 200 lb (90.7 kg)  12/25/16 200 lb (90.7 kg)  11/13/16 210 lb (95.3 kg)  Psych/Mental status: Alert, oriented x 3 (person, place, & time)       Eyes: PERLA Respiratory: No evidence of acute respiratory distress  Cervical Spine Area Exam  Skin & Axial Inspection: No masses, redness, edema, swelling, or associated skin lesions Alignment: Symmetrical Functional ROM: Unrestricted ROM      Stability: No instability detected Muscle Tone/Strength: Functionally intact. No obvious neuro-muscular anomalies detected. Sensory (Neurological): Unimpaired Palpation: No palpable anomalies              Upper Extremity (UE) Exam    Side: Right upper extremity  Side: Left upper extremity  Skin & Extremity Inspection: Skin color, temperature, and hair growth are WNL. No peripheral edema or cyanosis. No masses, redness, swelling, asymmetry, or associated skin lesions. No contractures.  Skin & Extremity Inspection: Skin color, temperature, and hair growth are WNL. No peripheral edema or cyanosis. No masses, redness, swelling, asymmetry, or associated skin lesions. No contractures.  Functional ROM: Unrestricted ROM           Functional ROM: Unrestricted ROM          Muscle Tone/Strength: Functionally intact. No obvious neuro-muscular anomalies detected.  Muscle Tone/Strength: Functionally intact. No obvious neuro-muscular anomalies detected.  Sensory (Neurological): Unimpaired          Sensory (Neurological): Unimpaired          Palpation: No palpable anomalies              Palpation: No palpable anomalies              Specialized Test(s): Deferred         Specialized Test(s): Deferred          Thoracic Spine Area Exam  Skin & Axial Inspection: No masses, redness, or swelling Alignment: Symmetrical Functional ROM: Unrestricted ROM Stability: No instability detected Muscle Tone/Strength: Functionally intact. No obvious neuro-muscular anomalies detected. Sensory (Neurological): Unimpaired Muscle strength & Tone: No palpable anomalies  Lumbar Spine Area Exam  Skin & Axial Inspection: No masses, redness, or swelling Alignment: Symmetrical Functional ROM: Unrestricted ROM      Stability: No instability detected Muscle Tone/Strength: Functionally intact. No obvious neuro-muscular anomalies detected. Sensory (Neurological): Unimpaired Palpation: No palpable anomalies       Provocative Tests: Lumbar Hyperextension and rotation test: evaluation deferred today       Lumbar Lateral bending test: evaluation deferred today       Patrick's Maneuver: evaluation deferred today                    Gait & Posture Assessment  Ambulation: Unassisted Gait: Relatively normal for age and body habitus Posture: WNL   Lower Extremity Exam    Side: Right lower extremity  Side: Left lower extremity  Skin & Extremity Inspection: Skin color, temperature, and hair growth are WNL. No peripheral edema or cyanosis. No masses, redness, swelling, asymmetry, or associated skin lesions. No contractures.  Skin & Extremity Inspection: Skin color, temperature, and hair growth are WNL. No peripheral edema or cyanosis. No masses,  redness,  swelling, asymmetry, or associated skin lesions. No contractures.  Functional ROM: Unrestricted ROM          Functional ROM: Unrestricted ROM          Muscle Tone/Strength: Functionally intact. No obvious neuro-muscular anomalies detected.  Muscle Tone/Strength: Functionally intact. No obvious neuro-muscular anomalies detected.  Sensory (Neurological): Unimpaired  Sensory (Neurological): Unimpaired  Palpation: No palpable anomalies  Palpation: No palpable anomalies   Assessment  Primary Diagnosis & Pertinent Problem List: The primary encounter diagnosis was Chronic pain syndrome. Diagnoses of Muscular dystrophy, Chronic bilateral low back pain without sciatica, Myalgia, and Hx of cocaine abuse were also pertinent to this visit.  Status Diagnosis  Controlled Controlled Controlled 1. Chronic pain syndrome   2. Muscular dystrophy   3. Chronic bilateral low back pain without sciatica   4. Myalgia   5. Hx of cocaine abuse     Problems updated and reviewed during this visit: Problem  Muscular Dystrophy  Chronic Bilateral Low Back Pain Without Sciatica  Myalgia   Plan of Care  Pharmacotherapy (Medications Ordered): Meds ordered this encounter  Medications  . pregabalin (LYRICA) 100 MG capsule    Sig: 100 mg in the qAM, 200 mg qPM    Dispense:  90 capsule    Refill:  4    Do not place this medication, or any other prescription from our practice, on "Automatic Refill". Patient may have prescription filled one day early if pharmacy is closed on scheduled refill date.    Provider-requested follow-up: Return in about 4 months (around 08/11/2017) for Medication Management.  Future Appointments  Date Time Provider [place]  04/17/2017  9:30 AM Ursula Alert, MD [facility] None  08/06/2017  8:30 AM Gillis Santa, MD [facility] None    Primary Care Physician: Dion Body, MD Location: [facility] Note by: Gillis Santa, M.D Date: 04/11/2017;  Time: 8:30 AM  Patient Instructions  You were given one prescription for Lyrica today.

## 2017-04-11 NOTE — Progress Notes (Signed)
Safety precautions to be maintained throughout the outpatient stay will include: orient to surroundings, keep bed in low position, maintain call bell within reach at all times, provide assistance with transfer out of bed and ambulation.  

## 2017-04-17 ENCOUNTER — Ambulatory Visit (INDEPENDENT_AMBULATORY_CARE_PROVIDER_SITE_OTHER): Payer: Medicare HMO | Admitting: Psychiatry

## 2017-04-17 ENCOUNTER — Other Ambulatory Visit: Payer: Self-pay

## 2017-04-17 ENCOUNTER — Encounter: Payer: Self-pay | Admitting: Psychiatry

## 2017-04-17 VITALS — BP 123/85 | HR 109 | Temp 97.5°F | Wt 218.4 lb

## 2017-04-17 DIAGNOSIS — G4733 Obstructive sleep apnea (adult) (pediatric): Secondary | ICD-10-CM

## 2017-04-17 DIAGNOSIS — F5105 Insomnia due to other mental disorder: Secondary | ICD-10-CM | POA: Diagnosis not present

## 2017-04-17 DIAGNOSIS — F411 Generalized anxiety disorder: Secondary | ICD-10-CM

## 2017-04-17 DIAGNOSIS — F331 Major depressive disorder, recurrent, moderate: Secondary | ICD-10-CM | POA: Diagnosis not present

## 2017-04-17 DIAGNOSIS — F4010 Social phobia, unspecified: Secondary | ICD-10-CM | POA: Diagnosis not present

## 2017-04-17 DIAGNOSIS — Z9989 Dependence on other enabling machines and devices: Secondary | ICD-10-CM | POA: Diagnosis not present

## 2017-04-17 MED ORDER — VENLAFAXINE HCL ER 75 MG PO CP24: 75.0000 mg | ORAL_CAPSULE | Freq: Every day | ORAL | 1 refills | Status: DC
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
## 2017-04-19 DIAGNOSIS — G4733 Obstructive sleep apnea (adult) (pediatric): Secondary | ICD-10-CM | POA: Diagnosis not present

## 2017-05-08 ENCOUNTER — Encounter: Payer: Self-pay | Admitting: Psychiatry

## 2017-05-08 ENCOUNTER — Other Ambulatory Visit: Payer: Self-pay

## 2017-05-08 ENCOUNTER — Ambulatory Visit (INDEPENDENT_AMBULATORY_CARE_PROVIDER_SITE_OTHER): Payer: Medicare HMO | Admitting: Psychiatry

## 2017-05-08 VITALS — BP 124/89 | HR 112 | Temp 97.5°F | Wt 218.4 lb

## 2017-05-08 DIAGNOSIS — G4733 Obstructive sleep apnea (adult) (pediatric): Secondary | ICD-10-CM

## 2017-05-08 DIAGNOSIS — F4010 Social phobia, unspecified: Secondary | ICD-10-CM | POA: Diagnosis not present

## 2017-05-08 DIAGNOSIS — Z9989 Dependence on other enabling machines and devices: Secondary | ICD-10-CM | POA: Diagnosis not present

## 2017-05-08 DIAGNOSIS — F411 Generalized anxiety disorder: Secondary | ICD-10-CM | POA: Diagnosis not present

## 2017-05-08 DIAGNOSIS — F331 Major depressive disorder, recurrent, moderate: Secondary | ICD-10-CM | POA: Diagnosis not present

## 2017-05-08 DIAGNOSIS — F5105 Insomnia due to other mental disorder: Secondary | ICD-10-CM

## 2017-05-08 MED ORDER — VENLAFAXINE HCL ER 150 MG PO CP24: 150.0000 mg | ORAL_CAPSULE | Freq: Every day | ORAL | 2 refills | Status: DC
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
## 2017-05-14 ENCOUNTER — Ambulatory Visit (INDEPENDENT_AMBULATORY_CARE_PROVIDER_SITE_OTHER): Payer: Medicare HMO | Admitting: Licensed Clinical Social Worker

## 2017-05-14 DIAGNOSIS — F4010 Social phobia, unspecified: Secondary | ICD-10-CM | POA: Diagnosis not present

## 2017-05-14 DIAGNOSIS — F331 Major depressive disorder, recurrent, moderate: Secondary | ICD-10-CM | POA: Diagnosis not present

## 2017-05-14 NOTE — Progress Notes (Signed)
  THERAPIST PROGRESS NOTE   Date of Service:   05/14/17  Session Time:   53mn  Patient:   SSHERRYN Williams  DOB:   [birth year] MR Number:  [id number] [facility]  [facility] [id number]: [phone]           Provider/Observer:  NLubertha SouthCounselor  Risk of Suicide/Violence: virtually non-existent   Diagnosis:    MDD (major depressive disorder), recurrent episode, moderate ([place]  Social anxiety disorder  Type of Therapy: Individual Therapy  Treatment Goals addressed: Coping and Diagnosis: Depression  Participation Level: Active   Interventions: CBT and Motivational Interviewing   Behavioral Response: CasualAlertEuthymic   Summary: Therapist met with Patient in an outpatient setting to assess current mood and assist with making progress towards goals through the use of therapeutic intervention. Therapist did a brief mood check, assessing anger, fear, disgust, excitement, happiness, and sadness.  Patient was able to list her current frustrations and reports being able to cope with life by taking short walks, visiting friends, going into the community.  Patient reports that she takes care of her mother who has had 2 strokes and her father.  She reports that she cries occasionally but her symptoms are managed with medication.  She reports having a significant other who she can rely on.  Explored coping strategies and journaling daily.    Plan: Meredith STOERMERwill continue to manage symptoms by using coping skills   Return again in 2 weeks.

## 2017-05-20 DIAGNOSIS — G4733 Obstructive sleep apnea (adult) (pediatric): Secondary | ICD-10-CM | POA: Diagnosis not present

## 2017-05-28 DIAGNOSIS — G7111 Myotonic muscular dystrophy: Secondary | ICD-10-CM | POA: Diagnosis not present

## 2017-05-28 DIAGNOSIS — L749 Eccrine sweat disorder, unspecified: Secondary | ICD-10-CM | POA: Insufficient documentation

## 2017-05-28 DIAGNOSIS — E538 Deficiency of other specified B group vitamins: Secondary | ICD-10-CM | POA: Diagnosis not present

## 2017-05-28 DIAGNOSIS — G43719 Chronic migraine without aura, intractable, without status migrainosus: Secondary | ICD-10-CM | POA: Diagnosis not present

## 2017-05-28 DIAGNOSIS — G894 Chronic pain syndrome: Secondary | ICD-10-CM | POA: Diagnosis not present

## 2017-06-05 ENCOUNTER — Other Ambulatory Visit: Payer: Self-pay

## 2017-06-05 ENCOUNTER — Ambulatory Visit (INDEPENDENT_AMBULATORY_CARE_PROVIDER_SITE_OTHER): Payer: Medicare HMO | Admitting: Psychiatry

## 2017-06-05 ENCOUNTER — Encounter: Payer: Self-pay | Admitting: Psychiatry

## 2017-06-05 VITALS — BP 113/82 | HR 114 | Temp 98.3°F | Wt 222.8 lb

## 2017-06-05 DIAGNOSIS — F4010 Social phobia, unspecified: Secondary | ICD-10-CM | POA: Diagnosis not present

## 2017-06-05 DIAGNOSIS — F411 Generalized anxiety disorder: Secondary | ICD-10-CM | POA: Diagnosis not present

## 2017-06-05 DIAGNOSIS — F5105 Insomnia due to other mental disorder: Secondary | ICD-10-CM | POA: Diagnosis not present

## 2017-06-05 DIAGNOSIS — F331 Major depressive disorder, recurrent, moderate: Secondary | ICD-10-CM

## 2017-06-05 MED ORDER — ZOLPIDEM TARTRATE 5 MG PO TABS: 5.0000 mg | ORAL_TABLET | Freq: Every evening | ORAL | 0 refills | Status: DC | PRN
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
## 2017-06-06 ENCOUNTER — Encounter: Payer: Self-pay | Admitting: Psychiatry

## 2017-06-19 DIAGNOSIS — G4733 Obstructive sleep apnea (adult) (pediatric): Secondary | ICD-10-CM | POA: Diagnosis not present

## 2017-06-25 DIAGNOSIS — G4733 Obstructive sleep apnea (adult) (pediatric): Secondary | ICD-10-CM | POA: Insufficient documentation

## 2017-06-25 DIAGNOSIS — L749 Eccrine sweat disorder, unspecified: Secondary | ICD-10-CM | POA: Diagnosis not present

## 2017-06-25 DIAGNOSIS — G7111 Myotonic muscular dystrophy: Secondary | ICD-10-CM | POA: Diagnosis not present

## 2017-06-25 DIAGNOSIS — G894 Chronic pain syndrome: Secondary | ICD-10-CM | POA: Diagnosis not present

## 2017-06-25 DIAGNOSIS — G43719 Chronic migraine without aura, intractable, without status migrainosus: Secondary | ICD-10-CM | POA: Diagnosis not present

## 2017-07-03 ENCOUNTER — Encounter: Payer: Self-pay | Admitting: Psychiatry

## 2017-07-03 ENCOUNTER — Ambulatory Visit (INDEPENDENT_AMBULATORY_CARE_PROVIDER_SITE_OTHER): Payer: Medicare HMO | Admitting: Psychiatry

## 2017-07-03 ENCOUNTER — Other Ambulatory Visit: Payer: Self-pay

## 2017-07-03 VITALS — BP 132/86 | HR 121 | Temp 97.8°F | Wt 220.0 lb

## 2017-07-03 DIAGNOSIS — F4010 Social phobia, unspecified: Secondary | ICD-10-CM | POA: Diagnosis not present

## 2017-07-03 DIAGNOSIS — F411 Generalized anxiety disorder: Secondary | ICD-10-CM

## 2017-07-03 DIAGNOSIS — F331 Major depressive disorder, recurrent, moderate: Secondary | ICD-10-CM | POA: Diagnosis not present

## 2017-07-03 DIAGNOSIS — F5105 Insomnia due to other mental disorder: Secondary | ICD-10-CM | POA: Diagnosis not present

## 2017-07-03 MED ORDER — VENLAFAXINE HCL ER 37.5 MG PO CP24: 37.5000 mg | ORAL_CAPSULE | Freq: Every day | ORAL | 2 refills | Status: DC

## 2017-07-03 MED ORDER — VENLAFAXINE HCL ER 150 MG PO CP24: 150.0000 mg | ORAL_CAPSULE | Freq: Every day | ORAL | 2 refills | Status: DC
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
## 2017-07-04 ENCOUNTER — Encounter: Payer: Self-pay | Admitting: Psychiatry

## 2017-07-16 ENCOUNTER — Encounter: Payer: Self-pay | Admitting: Psychiatry

## 2017-07-16 ENCOUNTER — Ambulatory Visit (INDEPENDENT_AMBULATORY_CARE_PROVIDER_SITE_OTHER): Payer: Medicare HMO | Admitting: Psychiatry

## 2017-07-16 ENCOUNTER — Other Ambulatory Visit: Payer: Self-pay

## 2017-07-16 ENCOUNTER — Ambulatory Visit (INDEPENDENT_AMBULATORY_CARE_PROVIDER_SITE_OTHER): Payer: Medicare HMO | Admitting: Licensed Clinical Social Worker

## 2017-07-16 VITALS — BP 126/86 | HR 118 | Temp 97.6°F | Wt 220.0 lb

## 2017-07-16 DIAGNOSIS — F4010 Social phobia, unspecified: Secondary | ICD-10-CM

## 2017-07-16 DIAGNOSIS — F411 Generalized anxiety disorder: Secondary | ICD-10-CM

## 2017-07-16 DIAGNOSIS — F331 Major depressive disorder, recurrent, moderate: Secondary | ICD-10-CM | POA: Diagnosis not present

## 2017-07-16 DIAGNOSIS — F5105 Insomnia due to other mental disorder: Secondary | ICD-10-CM

## 2017-07-16 NOTE — Progress Notes (Signed)
BH MD OP Progress Note  07/16/2017 5:09 PM Meredith Williams  MRN:  [id number]  Chief Complaint: ' I am here for follow up.' Chief Complaint    Follow-up; Medication Refill     [id number] is a 37 year-old Caucasian female, single, lives in [place], on [place], presented to the clinic today for a follow-up visit.  Patient has a history of depression, anxiety, chronic pain due to myotonic dystrophy, diabetes, severe migraine.  Patient continues to have psychosocial stressors of relationship conflicts with her family, her mother's health problems and so on.  Her mother continues to need 24/7 care at home.  Patient reports there are times when she feels frustrated and walks away or leave the house.  Patient reports her father continues to take care of her mother and he is getting frustrated himself.  Patient reports she is compliant with all her medications as prescribed.  She is currently on a higher dose of Pamelor being prescribed by her neurologist.  Discussed with patient that Pamelor is also on antidepressant/antianxiety agent.  Reports she is tolerating the Effexor well.  She denies any side effects.  Patient continues to be in psychotherapy with Ms. Kerby Nora which is going well.  Patient reports sleep is good on the Ambien.  She is also using CPAP.  Patient reports she continues to have support system from her friend. Visit Diagnosis:    ICD-10-CM   1. MDD (major depressive disorder), recurrent episode, moderate (HCC) F33.1   2. Social anxiety disorder F40.10   3. Insomnia due to mental disorder F51.05   4. GAD (generalized anxiety disorder) F41.1     Past Psychiatric History: Reviewed past psychiatric history from my progress note on 04/16/2017.  Past trials of Cymbalta-ineffective, Viibryd, BuSpar.  Past Medical History:  Past Medical History:  Diagnosis Date  . Anxiety   . Depression   . Diabetes mellitus without complication (HCC)   . Hyperlipidemia   . Muscular dystrophy, myotonic  (HCC)   . Pancreatitis     Past Surgical History:  Procedure Laterality Date  . ABDOMINAL HYSTERECTOMY    . HIP SURGERY Right 2016   "removal of a bone growth"    Family Psychiatric History: Reviewed family psychiatric history from my progress note on 04/17/2017.  Family History:  Family History  Problem Relation Age of Onset  . Stroke Mother   . Muscular dystrophy Mother   . Drug abuse Brother   . Depression Sister   . Anxiety disorder Sister   . Muscular dystrophy Sister   . Depression Sister   Substance abuse history: Denies   Social History: Lives in [place] with her parents.  She is on SSD.  I have reviewed social history from my progress note on 04/17/2017. Social History   Socioeconomic History  . Marital status: Single    Spouse name: Not on file  . Number of children: 0  . Years of education: Not on file  . Highest education level: Some college, no degree  Occupational History    Comment: disability  Social Needs  . Financial resource strain: Not hard at all  . Food insecurity:    Worry: Never true    Inability: Never true  . Transportation needs:    Medical: Yes    Non-medical: Yes  Tobacco Use  . Smoking status: Passive Smoke Exposure - Never Smoker  . Smokeless tobacco: Never Used  Substance and Sexual Activity  . Alcohol use: No    Alcohol/week: 0.0 oz  Frequency: Never  . Drug use: No  . Sexual activity: Yes    Birth control/protection: Pill  Lifestyle  . Physical activity:    Days per week: Not on file    Minutes per session: Not on file  . Stress: Very much  Relationships  . Social connections:    Talks on phone: More than three times a week    Gets together: More than three times a week    Attends religious service: Never    Active member of club or organization: No    Attends meetings of clubs or organizations: Never    Relationship status: Never married  Other Topics Concern  . Not on file  Social History Narrative   Patient  went to the ED d/t pain in her side.  Every time she eats, she has increased pain.  Ob/gyn reported after CT that she had an ovarian cyst.  She will be going ob/gyn today for an injection to help with this.     Allergies:  Allergies  Allergen Reactions  . Other Other (See Comments)    Patient states that she is allergic to any medication that is in a patch form as it will "go into her muscle."    Metabolic Disorder Labs: No results found for: HGBA1C, MPG No results found for: PROLACTIN Lab Results  Component Value Date   CHOL 168 06/15/2016   TRIG 285 (H) 06/15/2016   HDL 44 06/15/2016   CHOLHDL 3.8 06/15/2016   VLDL 57 (H) 06/15/2016   LDLCALC 67 06/15/2016   No results found for: TSH  Therapeutic Level Labs: No results found for: LITHIUM No results found for: VALPROATE No components found for:  CBMZ  Current Medications: Current Outpatient Medications  Medication Sig Dispense Refill  . aspirin EC 81 MG tablet Take 81 mg by mouth daily.    . cyclobenzaprine (FLEXERIL) 10 MG tablet Take 15 mg by mouth 2 (two) times daily as needed for muscle spasms.  0  . fenofibrate 160 MG tablet Take 160 mg by mouth daily.  0  . hydrOXYzine (VISTARIL) 25 MG capsule Take 1 capsule (25 mg total) by mouth 3 (three) times daily as needed for anxiety. 270 capsule 0  . ibuprofen (ADVIL,MOTRIN) 800 MG tablet Take 800 mg by mouth every 8 (eight) hours as needed.    . JUNEL FE 24 1-20 MG-MCG(24) tablet Take 1 tablet by mouth daily.  0  . metFORMIN (GLUCOPHAGE) 1000 MG tablet   0  . norgestrel-ethinyl estradiol (OGESTREL) 0.5-50 MG-MCG tablet Take by mouth.    . nortriptyline (PAMELOR) 10 MG capsule Take 3 tabs at night for a week then increase to 4 tabs at night for a week then increase to 5 tabs at night and continue that dose    . pregabalin (LYRICA) 100 MG capsule 100 mg in the qAM, 200 mg qPM 90 capsule 4  . venlafaxine XR (EFFEXOR XR) 150 MG 24 hr capsule Take 1 capsule (150 mg total) by mouth  daily with breakfast. To be combined with 37.5 mg 30 capsule 2  . venlafaxine XR (EFFEXOR-XR) 37.5 MG 24 hr capsule Take 1 capsule (37.5 mg total) by mouth daily with breakfast. To be combined with 150 mg 30 capsule 2  . zolpidem (AMBIEN) 5 MG tablet Take 1 tablet (5 mg total) by mouth at bedtime as needed for sleep. 90 tablet 0   No current facility-administered medications for this visit.      Musculoskeletal: Strength &  Muscle Tone: within normal limits Gait & Station: normal Patient leans: N/A  Psychiatric Specialty Exam: Review of Systems  Psychiatric/Behavioral: Positive for depression. The patient is nervous/anxious.   All other systems reviewed and are negative.   Blood pressure 126/86, pulse (!) 118, temperature 97.6 F (36.4 C), temperature source Oral, weight 220 lb (99.8 kg).Body mass index is 35.51 kg/m.  General Appearance: Casual  Eye Contact:  Fair  Speech:  Clear and Coherent  Volume:  Normal  Mood:  Anxious improving  Affect:  Congruent  Thought Process:  Goal Directed and Descriptions of Associations: Intact  Orientation:  Full (Time, Place, and Person)  Thought Content: Logical   Suicidal Thoughts:  No  Homicidal Thoughts:  No  Memory:  Immediate;   Fair Recent;   Fair Remote;   Fair  Judgement:  Fair  Insight:  Fair  Psychomotor Activity:  Normal  Concentration:  Concentration: Fair and Attention Span: Fair  Recall:  [employer] of Knowledge: Fair  Language: Fair  Akathisia:  No  Handed:  Right  AIMS (if indicated): na  Assets:  Communication Skills Desire for Improvement Social Support  ADL's:  Intact  Cognition: WNL  Sleep:  Fair   Screenings: PHQ2-9     Clinical Support from 04/11/2017 in [facility] Clinical Support from 12/25/2016 in [facility] Clinical Support from 10/30/2016 in [facility] Office Visit from  09/19/2016 in [facility]  PHQ-2 Total Score  0  0  0  0       Assessment and Plan: Aubrielle is a 37 year old Caucasian female who has a history of depression, anxiety, chronic pain, myotonic dystrophy, recent diagnosis of diabetes, severe migraine headaches, presented to the clinic today for a follow-up visit.  Patient continues to have psychosocial stressors of her mother's health issues, relationship problems and so on.  Patient is biologically predisposed given her family history which is positive for mental health problems and substance abuse problems.  Patient is motivated to continue medications as well as continue psychotherapy.  Plan MDD Continue Effexor XR 187.5 mg p.o. daily Continue CBT with Ms. Peacock Continue nortriptyline as prescribed per neurology.  For anxiety symptoms Continue Effexor XR as prescribed Hydroxyzine 25 mg p.o. 3 times daily as needed  For insomnia Ambien 5 mg p.o. nightly Continue CPAP.  Follow-up in clinic in 6-7 weeks.  More than 50 % of the time was spent for psychoeducation and supportive psychotherapy and care coordination.  This note was generated in part or whole with voice recognition software. Voice recognition is usually quite accurate but there are transcription errors that can and very often do occur. I apologize for any typographical errors that were not detected and corrected.         Jomarie Longs, MD 07/16/2017, 5:09 PM

## 2017-07-20 DIAGNOSIS — G4733 Obstructive sleep apnea (adult) (pediatric): Secondary | ICD-10-CM | POA: Diagnosis not present

## 2017-07-23 DIAGNOSIS — E781 Pure hyperglyceridemia: Secondary | ICD-10-CM | POA: Diagnosis not present

## 2017-07-23 DIAGNOSIS — E119 Type 2 diabetes mellitus without complications: Secondary | ICD-10-CM | POA: Diagnosis not present

## 2017-07-24 NOTE — Progress Notes (Signed)
   THERAPIST PROGRESS NOTE  Session Time: 60min  Participation Level: Active  Behavioral Response: CasualAlertFrustrated  Type of Therapy: Individual Therapy  Treatment Goals addressed: Coping  Interventions: CBT and Motivational Interviewing  Summary: Meredith LuzSarah K Williams is a 37 y.o. female who presents with continued symptoms of her diagnosis.  Patient reports being "irritated with her family."  Patient reports that she lives with her parents to help her father care for her mother.  Patient reports that her sister lived in the home for a while but did not help out.  Patient reports that she gets upset due to the lack of help her sister give the family.  Patient reports not being able to socialize or engage in age appropriate activities.  Discussion of the role of each person in the home as it pertains to her mother.  Lightly confronted Patient about the lack of assistance she provides parents once roles were discussed.  Patient reports that her mother only wants her father to help her so there is no need for her to do anything.  Assisted patient explore other things she can help with.  Encouraged Patient to have positive self talk for herself and her mother.  Suicidal/Homicidal: No  Plan: Return again in 2 weeks.  Diagnosis: Axis I: Depression    Axis II: No diagnosis    Marinda Elkicole M Peacock, LCSW 07/16/2017

## 2017-08-06 ENCOUNTER — Ambulatory Visit
Payer: Medicare HMO | Attending: Student in an Organized Health Care Education/Training Program | Admitting: Student in an Organized Health Care Education/Training Program

## 2017-08-06 ENCOUNTER — Other Ambulatory Visit: Payer: Self-pay

## 2017-08-06 ENCOUNTER — Encounter: Payer: Self-pay | Admitting: Student in an Organized Health Care Education/Training Program

## 2017-08-06 VITALS — BP 109/88 | HR 107 | Temp 98.6°F | Resp 16 | Ht 66.0 in | Wt 220.0 lb

## 2017-08-06 DIAGNOSIS — M791 Myalgia, unspecified site: Secondary | ICD-10-CM | POA: Diagnosis not present

## 2017-08-06 DIAGNOSIS — E781 Pure hyperglyceridemia: Secondary | ICD-10-CM | POA: Insufficient documentation

## 2017-08-06 DIAGNOSIS — F1411 Cocaine abuse, in remission: Secondary | ICD-10-CM

## 2017-08-06 DIAGNOSIS — G43919 Migraine, unspecified, intractable, without status migrainosus: Secondary | ICD-10-CM | POA: Insufficient documentation

## 2017-08-06 DIAGNOSIS — N83201 Unspecified ovarian cyst, right side: Secondary | ICD-10-CM | POA: Insufficient documentation

## 2017-08-06 DIAGNOSIS — E538 Deficiency of other specified B group vitamins: Secondary | ICD-10-CM | POA: Diagnosis not present

## 2017-08-06 DIAGNOSIS — F331 Major depressive disorder, recurrent, moderate: Secondary | ICD-10-CM | POA: Diagnosis not present

## 2017-08-06 DIAGNOSIS — G4700 Insomnia, unspecified: Secondary | ICD-10-CM | POA: Insufficient documentation

## 2017-08-06 DIAGNOSIS — M1611 Unilateral primary osteoarthritis, right hip: Secondary | ICD-10-CM | POA: Insufficient documentation

## 2017-08-06 DIAGNOSIS — N2881 Hypertrophy of kidney: Secondary | ICD-10-CM | POA: Diagnosis not present

## 2017-08-06 DIAGNOSIS — G8929 Other chronic pain: Secondary | ICD-10-CM

## 2017-08-06 DIAGNOSIS — Z7982 Long term (current) use of aspirin: Secondary | ICD-10-CM | POA: Diagnosis not present

## 2017-08-06 DIAGNOSIS — Z79899 Other long term (current) drug therapy: Secondary | ICD-10-CM | POA: Diagnosis not present

## 2017-08-06 DIAGNOSIS — M25561 Pain in right knee: Secondary | ICD-10-CM | POA: Insufficient documentation

## 2017-08-06 DIAGNOSIS — E559 Vitamin D deficiency, unspecified: Secondary | ICD-10-CM | POA: Insufficient documentation

## 2017-08-06 DIAGNOSIS — G894 Chronic pain syndrome: Secondary | ICD-10-CM

## 2017-08-06 DIAGNOSIS — I10 Essential (primary) hypertension: Secondary | ICD-10-CM | POA: Insufficient documentation

## 2017-08-06 DIAGNOSIS — G43719 Chronic migraine without aura, intractable, without status migrainosus: Secondary | ICD-10-CM

## 2017-08-06 DIAGNOSIS — F1410 Cocaine abuse, uncomplicated: Secondary | ICD-10-CM | POA: Diagnosis not present

## 2017-08-06 DIAGNOSIS — K859 Acute pancreatitis without necrosis or infection, unspecified: Secondary | ICD-10-CM | POA: Diagnosis not present

## 2017-08-06 DIAGNOSIS — F419 Anxiety disorder, unspecified: Secondary | ICD-10-CM | POA: Insufficient documentation

## 2017-08-06 DIAGNOSIS — E119 Type 2 diabetes mellitus without complications: Secondary | ICD-10-CM | POA: Insufficient documentation

## 2017-08-06 DIAGNOSIS — G7100 Muscular dystrophy, unspecified: Secondary | ICD-10-CM | POA: Diagnosis not present

## 2017-08-06 DIAGNOSIS — Z87898 Personal history of other specified conditions: Secondary | ICD-10-CM

## 2017-08-06 DIAGNOSIS — I7300 Raynaud's syndrome without gangrene: Secondary | ICD-10-CM | POA: Diagnosis not present

## 2017-08-06 DIAGNOSIS — Z7984 Long term (current) use of oral hypoglycemic drugs: Secondary | ICD-10-CM | POA: Insufficient documentation

## 2017-08-06 DIAGNOSIS — M545 Low back pain: Secondary | ICD-10-CM | POA: Diagnosis not present

## 2017-08-06 MED ORDER — DICLOFENAC SODIUM 75 MG PO TBEC: 75.0000 mg | DELAYED_RELEASE_TABLET | Freq: Two times a day (BID) | ORAL | 1 refills | Status: DC

## 2017-08-06 MED ORDER — PREGABALIN 100 MG PO CAPS: ORAL_CAPSULE | ORAL | 4 refills | Status: DC

## 2017-08-06 NOTE — Progress Notes (Signed)
Safety precautions to be maintained throughout the outpatient stay will include: orient to surroundings, keep bed in low position, maintain call bell within reach at all times, provide assistance with transfer out of bed and ambulation.  

## 2017-08-06 NOTE — Progress Notes (Signed)
Patient's Name: Meredith Williams  MRN: [id number]  Referring Provider: Dion Body, MD  DOB: December 17, 1980  PCP: Dion Body, MD  DOS: 08/06/2017  Note by: Gillis Santa, MD  Service setting: Ambulatory outpatient  Specialty: Interventional Pain Management  Location: [facility] (AMB) Pain Management Facility    Patient type: Established   Primary Reason(s) for Visit: Encounter for prescription drug management. (Level of risk: moderate)  CC: Knee Pain (right); Back Pain (low); and Joint Pain  HPI  Meredith Williams is a 37 y.o. year old, female patient, who comes today for a medication management evaluation. She has Acute pancreatitis; Pancreatitis; Insomnia; Hypertriglyceridemia; Encounter for therapeutic drug monitoring; Diabetes mellitus without complication ([place]); Depression; Chronic pain syndrome; Anxiety; Cocaine use; Intractable chronic migraine without aura and without status migrainosus; Raynaud phenomenon; Primary osteoarthritis of right hip; Myotonic muscular dystrophy ([place]); Raynaud's syndrome without gangrene; Vitamin D deficiency; Vitamin B12 deficiency; Right hip impingement syndrome; Muscular dystrophy ([place]); Chronic bilateral low back pain without sciatica; Myalgia; MDD (major depressive disorder), recurrent episode, moderate ([place]); Sweating abnormality; and OSA (obstructive sleep apnea) on their problem list. Her primarily concern today is the Knee Pain (right); Back Pain (low); and Joint Pain  Pain Assessment: Location: Right Knee Radiating: denies Onset: More than a month ago Duration: Chronic pain Quality: Shooting Severity: 8 /10 (subjective, self-reported pain score)  Note: Reported level is compatible with observation.                         When using our objective Pain Scale, levels between 6 and 10/10 are said to belong in an emergency room, as it progressively worsens from a 6/10, described as severely limiting, requiring emergency care not usually available at an outpatient pain  management facility. At a 6/10 level, communication becomes difficult and requires great effort. Assistance to reach the emergency department may be required. Facial flushing and profuse sweating along with potentially dangerous increases in heart rate and blood pressure will be evident. Effect on ADL: "I lay down alot" Timing: Constant Modifying factors: blue emu helps BP: 109/88  HR: (!) 107  [name] was last scheduled for an appointment on 04/11/2017 for medication management. During today's appointment we reviewed Meredith Williams's chronic pain status, as well as her outpatient medication regimen.  Overall patient's health is at baseline.  No changes in her baseline pain status. Endorsing frontal knee pain, non radiating.  She is continuing to see psychiatry for management of her depression.  She has also started seeing neurology for management of her migraines and is been started on amitriptyline.  She continues Lyrica as she has been prescribed from this clinic.  She is taking 100 mg every morning 200 mg every afternoon for a total dose of 300 mg daily.  Denies any side effects.  The patient  reports that she does not use drugs. Her body mass index is 35.51 kg/m.  Further details on both, my assessment(s), as well as the proposed treatment plan, please see below. It  Laboratory Chemistry  Inflammation Markers (CRP: Acute Phase) (ESR: Chronic Phase) Lab Results  Component Value Date   LATICACIDVEN 0.9 06/15/2016                         Rheumatology Markers No results found for: RF, ANA, [place], [place], [place], [place], HLAB27  Renal Function Markers Lab Results  Component Value Date   BUN 17 11/13/2016   CREATININE 1.06 (H) 11/13/2016   GFRAA >60 11/13/2016   GFRNONAA >60 11/13/2016                             Hepatic Function Markers Lab Results  Component Value Date   AST 25 11/13/2016   ALT 31 11/13/2016   ALBUMIN 4.4 11/13/2016   ALKPHOS 38  11/13/2016   LIPASE 25 11/13/2016                        Electrolytes Lab Results  Component Value Date   NA 136 11/13/2016   K 5.2 (H) 11/13/2016   CL 99 (L) 11/13/2016   CALCIUM 9.6 11/13/2016   MG 2.4 06/22/2016   PHOS 2.7 06/22/2016                        Neuropathy Markers Lab Results  Component Value Date   HIV Non Reactive 06/16/2016                        Bone Pathology Markers No results found for: [place], [id number], LT9030SP2, [id number], 25OHVITD1, 25OHVITD2, 25OHVITD3, TESTOFREE, TESTOSTERONE                       Coagulation Parameters Lab Results  Component Value Date   PLT 382 11/13/2016                        Cardiovascular Markers Lab Results  Component Value Date   TROPONINI <0.03 06/15/2016   HGB 16.3 (H) 11/13/2016   HCT 48.9 (H) 11/13/2016                         CA Markers No results found for: CEA, CA125, LABCA2                      Note: Lab results reviewed.  Recent Diagnostic Imaging Results  CT ABDOMEN PELVIS W CONTRAST CLINICAL DATA:  Severe mid abdominal pain, nausea and vomiting since 11/15/2016.  EXAM: CT ABDOMEN AND PELVIS WITH CONTRAST  TECHNIQUE: Multidetector CT imaging of the abdomen and pelvis was performed using the standard protocol following bolus administration of intravenous contrast.  CONTRAST:  100 cc Isovue 370 IV  COMPARISON:  06/23/2016  FINDINGS: Lower chest: Normal size heart without pericardial effusion. Linear platelike atelectasis and/or scarring within each lower lobe with subsegmental atelectasis in the lingula and left lower lobe as well. No dominant mass, pneumonic consolidation or effusion.  Hepatobiliary: Hypodense appearance of the liver consistent with hepatic steatosis. No space-occupying mass. Normal gallbladder without stones. No biliary dilatation.  Pancreas: Mild fatty infiltration of the pancreatic head and uncinate process. No dominant mass or ductal dilatation.  No inflammation.  Spleen: Normal  Adrenals/Urinary Tract: Normal bilateral adrenal glands. Atrophic left kidney with compensatory hypertrophy of the right. Retroaortic left renal vein. No obstructive uropathy. Decompressed urinary bladder without focal mural thickening or calculus.  Stomach/Bowel: Go physiologic distention of the stomach with enteric contrast. There is normal small bowel rotation and ligament of Treitz position. No small bowel obstruction or inflammation. Normal-appearing appendix. An average amount of stool is noted within the colon. No acute large bowel abnormality.  Vascular/Lymphatic: No  aortic aneurysm or dissection. No lymphadenopathy.  Reproductive: Status post hysterectomy. Peripherally enhancing 19 mm ruptured cyst or follicle of the right ovary with associated small amount of free fluid in the cul-de-sac.  Other: No abdominal wall hernia.  Musculoskeletal: No acute or significant osseous findings.  IMPRESSION: 1. Irregular peripherally enhancing 19 mm right ovarian cyst or follicle with associated free fluid in the cul-de-sac. Findings may reflect a ruptured/hemorrhagic cyst or corpus luteum. 2. Atrophic left kidney with compensatory hypertrophy of the right kidney as before. No obstructive uropathy. 3. No acute bowel obstruction or inflammation. 4. Unremarkable appearance of the pancreas with resolved peripancreatic inflammation since prior exam. No pseudocyst formation or ductal dilatation. It should be noted that the pancreas can appear normal on CT despite clinical pancreatitis and therefore laboratory correlation is recommended.  Electronically Signed   By: Ashley Royalty M.D.   On: 11/20/2016 14:35  Complexity Note: Imaging results reviewed. Results shared with Ms. Kneisley, using Layman's terms.                         Meds   Current Outpatient Medications:  .  aspirin EC 81 MG tablet, Take 81 mg by mouth daily., Disp: , Rfl:  .   cyclobenzaprine (FLEXERIL) 10 MG tablet, Take 15 mg by mouth 2 (two) times daily as needed for muscle spasms., Disp: , Rfl: 0 .  fenofibrate 160 MG tablet, Take 160 mg by mouth daily., Disp: , Rfl: 0 .  hydrOXYzine (VISTARIL) 25 MG capsule, Take 1 capsule (25 mg total) by mouth 3 (three) times daily as needed for anxiety., Disp: 270 capsule, Rfl: 0 .  ibuprofen (ADVIL,MOTRIN) 800 MG tablet, Take 800 mg by mouth every 8 (eight) hours as needed., Disp: , Rfl:  .  metFORMIN (GLUCOPHAGE) 1000 MG tablet, , Disp: , Rfl: 0 .  norgestrel-ethinyl estradiol (OGESTREL) 0.5-50 MG-MCG tablet, Take by mouth., Disp: , Rfl:  .  nortriptyline (PAMELOR) 50 MG capsule, Take 50 mg by mouth at bedtime., Disp: , Rfl: 0 .  pregabalin (LYRICA) 100 MG capsule, 100 mg in the qAM, 200 mg qPM, Disp: 90 capsule, Rfl: 4 .  venlafaxine XR (EFFEXOR XR) 150 MG 24 hr capsule, Take 1 capsule (150 mg total) by mouth daily with breakfast. To be combined with 37.5 mg, Disp: 30 capsule, Rfl: 2 .  venlafaxine XR (EFFEXOR-XR) 37.5 MG 24 hr capsule, Take 1 capsule (37.5 mg total) by mouth daily with breakfast. To be combined with 150 mg, Disp: 30 capsule, Rfl: 2 .  zolpidem (AMBIEN) 5 MG tablet, Take 1 tablet (5 mg total) by mouth at bedtime as needed for sleep., Disp: 90 tablet, Rfl: 0 .  diclofenac (VOLTAREN) 75 MG EC tablet, Take 1 tablet (75 mg total) by mouth 2 (two) times daily., Disp: 60 tablet, Rfl: 1 .  JUNEL FE 24 1-20 MG-MCG(24) tablet, Take 1 tablet by mouth daily., Disp: , Rfl: 0 .  nortriptyline (PAMELOR) 10 MG capsule, Take 3 tabs at night for a week then increase to 4 tabs at night for a week then increase to 5 tabs at night and continue that dose, Disp: , Rfl:   ROS  Constitutional: Denies any fever or chills Gastrointestinal: No reported hemesis, hematochezia, vomiting, or acute GI distress Musculoskeletal: Denies any acute onset joint swelling, redness, loss of ROM, or weakness Neurological: No reported episodes of  acute onset apraxia, aphasia, dysarthria, agnosia, amnesia, paralysis, loss of coordination, or loss of consciousness  Allergies  Ms. Mikkelsen is allergic to other.  PFSH  Drug: Ms. Abts  reports that she does not use drugs. Alcohol:  reports that she does not drink alcohol. Tobacco:  reports that she is a non-smoker but has been exposed to tobacco smoke. She has never used smokeless tobacco. Medical:  has a past medical history of Anxiety, Depression, Diabetes mellitus without complication ([place]), Hyperlipidemia, Muscular dystrophy, myotonic ([place]), and Pancreatitis. Surgical: Ms. Billman  has a past surgical history that includes Abdominal hysterectomy and Hip surgery (Right, 2016). Family: family history includes Anxiety disorder in her sister; Depression in her sister and sister; Drug abuse in her brother; Muscular dystrophy in her mother and sister; Stroke in her mother.  Constitutional Exam  General appearance: Well nourished, well developed, and well hydrated. In no apparent acute distress Vitals:   08/06/17 0840 08/06/17 0841  BP:  109/88  Pulse:  (!) 107  Resp:  16  Temp:  98.6 F (37 C)  SpO2:  99%  Weight: 220 lb (99.8 kg)   Height: _0  (1.676 m)    BMI Assessment: Estimated body mass index is 35.51 kg/m as calculated from the following:   Height as of this encounter: _1  (1.676 m).   Weight as of this encounter: 220 lb (99.8 kg).  BMI interpretation table: BMI level Category Range association with higher incidence of chronic pain  <18 kg/m2 Underweight   18.5-24.9 kg/m2 Ideal body weight   25-29.9 kg/m2 Overweight Increased incidence by 20%  30-34.9 kg/m2 Obese (Class I) Increased incidence by 68%  35-39.9 kg/m2 Severe obesity (Class II) Increased incidence by 136%  >40 kg/m2 Extreme obesity (Class III) Increased incidence by 254%   Patient's current BMI Ideal Body weight  Body mass index is 35.51 kg/m. Ideal body weight: 59.3 kg (130 lb 11.7 oz) Adjusted ideal body  weight: 75.5 kg (166 lb 7 oz)   BMI Readings from Last 4 Encounters:  08/06/17 35.51 kg/m  04/11/17 35.51 kg/m  01/17/17 33.28 kg/m  12/25/16 33.28 kg/m   Wt Readings from Last 4 Encounters:  08/06/17 220 lb (99.8 kg)  04/11/17 220 lb (99.8 kg)  01/17/17 200 lb (90.7 kg)  12/25/16 200 lb (90.7 kg)  Psych/Mental status: Alert, oriented x 3 (person, place, & time)       Eyes: PERLA Respiratory: No evidence of acute respiratory distress  Cervical Spine Area Exam  Skin & Axial Inspection: No masses, redness, edema, swelling, or associated skin lesions Alignment: Symmetrical Functional ROM: Unrestricted ROM      Stability: No instability detected Muscle Tone/Strength: Functionally intact. No obvious neuro-muscular anomalies detected. Sensory (Neurological): Unimpaired Palpation: No palpable anomalies              Upper Extremity (UE) Exam    Side: Right upper extremity  Side: Left upper extremity  Skin & Extremity Inspection: Skin color, temperature, and hair growth are WNL. No peripheral edema or cyanosis. No masses, redness, swelling, asymmetry, or associated skin lesions. No contractures.  Skin & Extremity Inspection: Skin color, temperature, and hair growth are WNL. No peripheral edema or cyanosis. No masses, redness, swelling, asymmetry, or associated skin lesions. No contractures.  Functional ROM: Unrestricted ROM          Functional ROM: Unrestricted ROM          Muscle Tone/Strength: Functionally intact. No obvious neuro-muscular anomalies detected.  Muscle Tone/Strength: Functionally intact. No obvious neuro-muscular anomalies detected.  Sensory (Neurological): Unimpaired  Sensory (Neurological): Unimpaired          Palpation: No palpable anomalies              Palpation: No palpable anomalies              Provocative Test(s):  Phalen's test: deferred Tinel's test: deferred Apley's scratch test (touch opposite shoulder):  Action 1 (Across chest): deferred Action  2 (Overhead): deferred Action 3 (LB reach): deferred   Provocative Test(s):  Phalen's test: deferred Tinel's test: deferred Apley's scratch test (touch opposite shoulder):  Action 1 (Across chest): deferred Action 2 (Overhead): deferred Action 3 (LB reach): deferred    Thoracic Spine Area Exam  Skin & Axial Inspection: No masses, redness, or swelling Alignment: Symmetrical Functional ROM: Unrestricted ROM Stability: No instability detected Muscle Tone/Strength: Functionally intact. No obvious neuro-muscular anomalies detected. Sensory (Neurological): Unimpaired Muscle strength & Tone: No palpable anomalies  Lumbar Spine Area Exam  Skin & Axial Inspection: No masses, redness, or swelling Alignment: Symmetrical Functional ROM: Unrestricted ROM       Stability: No instability detected Muscle Tone/Strength: Functionally intact. No obvious neuro-muscular anomalies detected. Sensory (Neurological): Unimpaired Palpation: No palpable anomalies       Provocative Tests: Lumbar Hyperextension/rotation test: deferred today       Lumbar quadrant test (Kemp's test): deferred today       Lumbar Lateral bending test: deferred today       Patrick's Maneuver: deferred today                   FABER test: deferred today                   Thigh-thrust test: deferred today       S-I compression test: deferred today       S-I distraction test: deferred today        Gait & Posture Assessment  Ambulation: Unassisted Gait: Relatively normal for age and body habitus Posture: WNL   Lower Extremity Exam    Side: Right lower extremity  Side: Left lower extremity  Stability: No instability observed          Stability: No instability observed          Skin & Extremity Inspection: Skin color, temperature, and hair growth are WNL. No peripheral edema or cyanosis. No masses, redness, swelling, asymmetry, or associated skin lesions. No contractures.  Skin & Extremity Inspection: Skin color, temperature,  and hair growth are WNL. No peripheral edema or cyanosis. No masses, redness, swelling, asymmetry, or associated skin lesions. No contractures.  Functional ROM: Unrestricted ROM                  Functional ROM: Unrestricted ROM                  Muscle Tone/Strength: Functionally intact. No obvious neuro-muscular anomalies detected.  Muscle Tone/Strength: Functionally intact. No obvious neuro-muscular anomalies detected.  Sensory (Neurological): Unimpaired  Sensory (Neurological): Unimpaired  Palpation: No palpable anomalies  Palpation: No palpable anomalies   Assessment  Primary Diagnosis & Pertinent Problem List: The primary encounter diagnosis was Chronic pain syndrome. Diagnoses of Muscular dystrophy ([place]), Chronic bilateral low back pain without sciatica, Myalgia, Hx of cocaine abuse, MDD (major depressive disorder), recurrent episode, moderate ([place]), and Intractable chronic migraine without aura and without status migrainosus were also pertinent to this visit.  Status Diagnosis  Controlled Controlled Controlled 1. Chronic pain syndrome   2. Muscular dystrophy ([place])  3. Chronic bilateral low back pain without sciatica   4. Myalgia   5. Hx of cocaine abuse   6. MDD (major depressive disorder), recurrent episode, moderate ([place])   7. Intractable chronic migraine without aura and without status migrainosus      37 year old female with a history of chronic pain syndrome secondary to muscular dystrophy who presents for medication management.  Patient overall has been doing well and is stable from a chronic pain standpoint.  Patient is endorsing worsening knee pain that is unchanged sitting versus standing.  She describes it as primarily frontal knee pain that is nonradiating.  Patient's previous creatinine was within normal limits.  Discussed trial of diclofenac 75 mill grams twice daily to help out with her knee pain symptoms.  Otherwise patient can continue Lyrica as currently prescribed 100  mg every morning, 200 mg every afternoon.  If diclofenac is not effective for her knee pain, can consider steroid injection versus genicular nerve block.  Patient also endorsing episodes of flushing, redness, periods of hypertension.  Question whether patient is having mild symptoms of serotonin syndrome.  Recommend that she discuss her psychotropic medications with her psychiatrist.  Plan of Care  Pharmacotherapy (Medications Ordered): Meds ordered this encounter  Medications  . pregabalin (LYRICA) 100 MG capsule    Sig: 100 mg in the qAM, 200 mg qPM    Dispense:  90 capsule    Refill:  4    Do not place this medication, or any other prescription from our practice, on "Automatic Refill". Patient may have prescription filled one day early if pharmacy is closed on scheduled refill date.  . diclofenac (VOLTAREN) 75 MG EC tablet    Sig: Take 1 tablet (75 mg total) by mouth 2 (two) times daily.    Dispense:  60 tablet    Refill:  1   Considering:   Intra-articular knee steroid injections Bilateral genicular nerve block    Provider-requested follow-up: Return in about 4 months (around 12/07/2017) for Medication Management.  Time Note: Greater than 50% of the 25 minute(s) of face-to-face time spent with Ms. Wiacek, was spent in counseling/coordination of care regarding: Ms. Yinger primary cause of pain, the treatment plan, treatment alternatives, medication side effects, going over the informed consent, the appropriate use of her medications, realistic expectations, the goals of pain management (increased in functionality) and the need to bring and keep the BMI below 30.  Future Appointments  Date Time Provider [place]  09/03/2017  8:00 AM Lubertha South, LCSW [facility] None  09/03/2017  9:00 AM Ursula Alert, MD [facility] None  12/05/2017  8:30 AM Gillis Santa, MD [facility] None    Primary Care Physician: Dion Body, MD Location: [facility]  Facility Note by: Gillis Santa, M.D Date: 08/06/2017; Time: 9:27 AM  Patient Instructions  Voltaren has been escribed to your pharmacy.  You have been given Rx (with 4 refills) for Lyrica.

## 2017-08-06 NOTE — Patient Instructions (Signed)
Voltaren has been escribed to your pharmacy.  You have been given Rx (with 4 refills) for Lyrica.

## 2017-08-07 DIAGNOSIS — E119 Type 2 diabetes mellitus without complications: Secondary | ICD-10-CM | POA: Diagnosis not present

## 2017-08-07 DIAGNOSIS — E781 Pure hyperglyceridemia: Secondary | ICD-10-CM | POA: Diagnosis not present

## 2017-08-14 DIAGNOSIS — E669 Obesity, unspecified: Secondary | ICD-10-CM | POA: Diagnosis not present

## 2017-08-14 DIAGNOSIS — L309 Dermatitis, unspecified: Secondary | ICD-10-CM | POA: Diagnosis not present

## 2017-08-14 DIAGNOSIS — Z794 Long term (current) use of insulin: Secondary | ICD-10-CM | POA: Diagnosis not present

## 2017-08-14 DIAGNOSIS — E781 Pure hyperglyceridemia: Secondary | ICD-10-CM | POA: Diagnosis not present

## 2017-08-14 DIAGNOSIS — E119 Type 2 diabetes mellitus without complications: Secondary | ICD-10-CM | POA: Diagnosis not present

## 2017-08-14 DIAGNOSIS — F331 Major depressive disorder, recurrent, moderate: Secondary | ICD-10-CM | POA: Diagnosis not present

## 2017-08-14 DIAGNOSIS — Z0000 Encounter for general adult medical examination without abnormal findings: Secondary | ICD-10-CM | POA: Diagnosis not present

## 2017-08-15 ENCOUNTER — Encounter: Payer: Self-pay | Admitting: Psychiatry

## 2017-08-15 ENCOUNTER — Encounter: Payer: Medicare HMO | Admitting: Psychiatry

## 2017-08-15 ENCOUNTER — Other Ambulatory Visit: Payer: Self-pay

## 2017-08-15 VITALS — BP 123/85 | HR 106 | Temp 98.4°F | Wt 218.8 lb

## 2017-08-19 DIAGNOSIS — G4733 Obstructive sleep apnea (adult) (pediatric): Secondary | ICD-10-CM | POA: Diagnosis not present

## 2017-08-26 DIAGNOSIS — G4733 Obstructive sleep apnea (adult) (pediatric): Secondary | ICD-10-CM | POA: Diagnosis not present

## 2017-08-30 DIAGNOSIS — R21 Rash and other nonspecific skin eruption: Secondary | ICD-10-CM | POA: Diagnosis not present

## 2017-09-03 ENCOUNTER — Ambulatory Visit (INDEPENDENT_AMBULATORY_CARE_PROVIDER_SITE_OTHER): Payer: Medicare HMO | Admitting: Psychiatry

## 2017-09-03 ENCOUNTER — Ambulatory Visit: Payer: Medicare HMO | Admitting: Licensed Clinical Social Worker

## 2017-09-03 ENCOUNTER — Encounter: Payer: Self-pay | Admitting: Psychiatry

## 2017-09-03 ENCOUNTER — Other Ambulatory Visit: Payer: Self-pay

## 2017-09-03 VITALS — BP 126/87 | HR 103 | Temp 98.9°F | Wt 221.8 lb

## 2017-09-03 DIAGNOSIS — F331 Major depressive disorder, recurrent, moderate: Secondary | ICD-10-CM

## 2017-09-03 DIAGNOSIS — F411 Generalized anxiety disorder: Secondary | ICD-10-CM | POA: Diagnosis not present

## 2017-09-03 DIAGNOSIS — F5105 Insomnia due to other mental disorder: Secondary | ICD-10-CM

## 2017-09-03 DIAGNOSIS — F4010 Social phobia, unspecified: Secondary | ICD-10-CM | POA: Diagnosis not present

## 2017-09-03 DIAGNOSIS — G4733 Obstructive sleep apnea (adult) (pediatric): Secondary | ICD-10-CM

## 2017-09-03 DIAGNOSIS — Z9989 Dependence on other enabling machines and devices: Secondary | ICD-10-CM | POA: Diagnosis not present

## 2017-09-03 MED ORDER — VENLAFAXINE HCL ER 37.5 MG PO CP24: 37.5000 mg | ORAL_CAPSULE | Freq: Every day | ORAL | 2 refills | Status: DC

## 2017-09-03 MED ORDER — FLUOXETINE HCL 20 MG PO CAPS: 20.0000 mg | ORAL_CAPSULE | Freq: Every day | ORAL | 0 refills | Status: DC

## 2017-09-03 MED ORDER — ZOLPIDEM TARTRATE 5 MG PO TABS: 5.0000 mg | ORAL_TABLET | Freq: Every evening | ORAL | 0 refills | Status: DC | PRN

## 2017-09-03 MED ORDER — HYDROXYZINE PAMOATE 25 MG PO CAPS: 25.0000 mg | ORAL_CAPSULE | Freq: Three times a day (TID) | ORAL | 0 refills | Status: DC | PRN

## 2017-09-03 NOTE — Progress Notes (Signed)
BH MD OP Progress Note  09/03/2017 12:07 PM Meredith Williams  MRN:  [id number]  Chief Complaint: ' I am here for follow up." Chief Complaint    Follow-up; Medication Refill     HPI: Meredith Williams is a 37 year old Caucasian female, single, lives in [place], on [place], presented to the clinic today for a follow-up visit.  Patient has a history of depression, anxiety, chronic pain due to myotonic dystrophy, diabetes, severe migraine.  She today reports she continues to have psychosocial stressors of relationship conflict with her family, her mother in particular.  She also has recent health problems.  She reports she was recently treated for possible bed bug bites and was on steroids.  She also has a recent diagnosis of diabetes mellitus and is currently taking insulin.   She reports she has been trying to give herself space from her mother and let her father deal with her.  She however reports she would like her Effexor to be changed if possible because she feels it is not helping at all.  She reports she is tearful all the time.  She also reports increased appetite.  She reports sleep is fair on the Ambien and the CPAP.  She reports she has not been to the therapist in a long time due to scheduling problems.  Patient denies any suicidality.  Patient denies any perceptual disturbances.  Patient reports she has been trying to make use of her social support system like her father, her friends and so on. Visit Diagnosis:    ICD-10-CM   1. MDD (major depressive disorder), recurrent episode, moderate (HCC) F33.1 zolpidem (AMBIEN) 5 MG tablet    venlafaxine XR (EFFEXOR-XR) 37.5 MG 24 hr capsule    FLUoxetine (PROZAC) 20 MG capsule  2. Social anxiety disorder F40.10 FLUoxetine (PROZAC) 20 MG capsule  3. Insomnia due to mental disorder F51.05 zolpidem (AMBIEN) 5 MG tablet  4. GAD (generalized anxiety disorder) F41.1 hydrOXYzine (VISTARIL) 25 MG capsule    venlafaxine XR (EFFEXOR-XR) 37.5 MG 24 hr capsule     FLUoxetine (PROZAC) 20 MG capsule  5. OSA on CPAP G47.33    Z99.89     Past Psychiatric History: I have reviewed past psychiatric history from my progress note on 04/16/2017.  Past trials of Cymbalta-ineffective, Viibryd, BuSpar.  Past Medical History:  Past Medical History:  Diagnosis Date  . Anxiety   . Depression   . Diabetes mellitus without complication (HCC)   . Hyperlipidemia   . Muscular dystrophy, myotonic (HCC)   . Pancreatitis     Past Surgical History:  Procedure Laterality Date  . ABDOMINAL HYSTERECTOMY    . HIP SURGERY Right 2016   "removal of a bone growth"    Family Psychiatric History: Reviewed family psychiatric history from my progress note on 04/16/2017.  Family History:  Family History  Problem Relation Age of Onset  . Stroke Mother   . Muscular dystrophy Mother   . Drug abuse Brother   . Depression Sister   . Anxiety disorder Sister   . Muscular dystrophy Sister   . Depression Sister     Social History: Reviewed social history from my progress note on 04/16/2017. Social History   Socioeconomic History  . Marital status: Single    Spouse name: Not on file  . Number of children: 0  . Years of education: Not on file  . Highest education level: Some college, no degree  Occupational History    Comment: disability  Social Needs  .  Financial resource strain: Not hard at all  . Food insecurity:    Worry: Never true    Inability: Never true  . Transportation needs:    Medical: Yes    Non-medical: Yes  Tobacco Use  . Smoking status: Passive Smoke Exposure - Never Smoker  . Smokeless tobacco: Never Used  Substance and Sexual Activity  . Alcohol use: No    Alcohol/week: 0.0 standard drinks    Frequency: Never  . Drug use: No  . Sexual activity: Yes    Birth control/protection: Pill  Lifestyle  . Physical activity:    Days per week: Not on file    Minutes per session: Not on file  . Stress: Very much  Relationships  . Social connections:     Talks on phone: More than three times a week    Gets together: More than three times a week    Attends religious service: Never    Active member of club or organization: No    Attends meetings of clubs or organizations: Never    Relationship status: Never married  Other Topics Concern  . Not on file  Social History Narrative   Patient went to the ED d/t pain in her side.  Every time she eats, she has increased pain.  Ob/gyn reported after CT that she had an ovarian cyst.  She will be going ob/gyn today for an injection to help with this.     Allergies:  Allergies  Allergen Reactions  . Other Other (See Comments)    Patient states that she is allergic to any medication that is in a patch form as it will "go into her muscle."    Metabolic Disorder Labs: No results found for: HGBA1C, MPG No results found for: PROLACTIN Lab Results  Component Value Date   CHOL 168 06/15/2016   TRIG 285 (H) 06/15/2016   HDL 44 06/15/2016   CHOLHDL 3.8 06/15/2016   VLDL 57 (H) 06/15/2016   LDLCALC 67 06/15/2016   No results found for: TSH  Therapeutic Level Labs: No results found for: LITHIUM No results found for: VALPROATE No components found for:  CBMZ  Current Medications: Current Outpatient Medications  Medication Sig Dispense Refill  . aspirin EC 81 MG tablet Take 81 mg by mouth daily.    . cyclobenzaprine (FLEXERIL) 10 MG tablet Take 15 mg by mouth 2 (two) times daily as needed for muscle spasms.  0  . diclofenac (VOLTAREN) 75 MG EC tablet Take 1 tablet (75 mg total) by mouth 2 (two) times daily. 60 tablet 1  . fenofibrate 160 MG tablet Take 160 mg by mouth daily.  0  . hydrOXYzine (VISTARIL) 25 MG capsule Take 1 capsule (25 mg total) by mouth 3 (three) times daily as needed for anxiety. 270 capsule 0  . ibuprofen (ADVIL,MOTRIN) 800 MG tablet Take 800 mg by mouth every 8 (eight) hours as needed.    . JUNEL FE 24 1-20 MG-MCG(24) tablet Take 1 tablet by mouth daily.  0  . metFORMIN  (GLUCOPHAGE) 1000 MG tablet   0  . niacin (NIASPAN) 500 MG CR tablet Take by mouth.    . norgestrel-ethinyl estradiol (OGESTREL) 0.5-50 MG-MCG tablet Take by mouth.    . nortriptyline (PAMELOR) 50 MG capsule Take 50 mg by mouth at bedtime.  0  . pregabalin (LYRICA) 100 MG capsule 100 mg in the qAM, 200 mg qPM 90 capsule 4  . triamcinolone cream (KENALOG) 0.5 % Apply topically.    .Marland Kitchen  venlafaxine XR (EFFEXOR-XR) 37.5 MG 24 hr capsule Take 1 capsule (37.5 mg total) by mouth daily with breakfast. To be taken daily for 1 week and start taking every other day after that for a week and stop it. 30 capsule 2  . zolpidem (AMBIEN) 5 MG tablet Take 1 tablet (5 mg total) by mouth at bedtime as needed for sleep. 90 tablet 0  . FLUoxetine (PROZAC) 20 MG capsule Take 1-2 capsules (20-40 mg total) by mouth daily. Take 1 capsule for 2 weeks and then start taking 2 caps 60 capsule 0   No current facility-administered medications for this visit.      Musculoskeletal: Strength & Muscle Tone: within normal limits Gait & Station: normal Patient leans: N/A  Psychiatric Specialty Exam: Review of Systems  Psychiatric/Behavioral: Positive for depression. The patient is nervous/anxious.   All other systems reviewed and are negative.   Blood pressure 126/87, pulse (!) 103, temperature 98.9 F (37.2 C), temperature source Oral, weight 221 lb 12.8 oz (100.6 kg).Body mass index is 35.8 kg/m.  General Appearance: Casual  Eye Contact:  Fair  Speech:  Normal Rate  Volume:  Normal  Mood:  Anxious and Dysphoric  Affect:  Congruent  Thought Process:  Goal Directed and Descriptions of Associations: Intact  Orientation:  Full (Time, Place, and Person)  Thought Content: Logical   Suicidal Thoughts:  No  Homicidal Thoughts:  No  Memory:  Immediate;   Fair Recent;   Fair Remote;   Fair  Judgement:  Fair  Insight:  Fair  Psychomotor Activity:  Normal  Concentration:  Concentration: Fair and Attention Span: Fair   Recall:  [employer] of Knowledge: Fair  Language: Fair  Akathisia:  No  Handed:  Right  AIMS (if indicated): na  Assets:  Communication Skills Desire for Improvement Social Support  ADL's:  Intact  Cognition: WNL  Sleep:  Fair   Screenings: PHQ2-9     Clinical Support from 08/06/2017 in [facility] Clinical Support from 04/11/2017 in [facility] Clinical Support from 12/25/2016 in [facility] Clinical Support from 10/30/2016 in [facility] Office Visit from 09/19/2016 in [facility]  PHQ-2 Total Score  0  0  0  0  0       Assessment and Plan: Meredith Williams is 37 year old Caucasian female who has a history of depression, anxiety, chronic pain, myotonic dystrophy, recent diagnosis of diabetes, severe migraine headaches, presented to the clinic today for a follow-up visit.  Patient continues to struggle with mood symptoms and has several psychosocial stressors of relationship conflicts as well as her own health issues.  Discussed medication changes as noted below.  Plan MDD Discontinue Effexor for lack of efficacy.  Patient will start taking Effexor 37.5 mg daily for a week and take it every other day after that for a week and stop it. Start Prozac 20 mg p.o. daily for 2 weeks and increase to 40 mg after 2 weeks. Continue nortriptyline as prescribed per neurology.  For anxiety symptoms Continue hydroxyzine 25 mg p.o. 3 times daily as needed  For insomnia Ambien 5 mg p.o. nightly Continue CPAP  Pt provided with list of therapist in the neighborhood to start CBT.  Follow up in clinic in 3 weeks or sooner if needed.  More than 50 % of the time was spent for psychoeducation and supportive  psychotherapy and care coordination.  This note was generated in part or whole with voice  recognition software. Voice recognition is usually quite accurate but there are transcription errors that can and very often do occur. I apologize for any typographical errors that were not detected and corrected.        Jomarie LongsSaramma Jasmyne Lodato, MD 09/03/2017, 12:07 PM

## 2017-09-03 NOTE — Patient Instructions (Signed)
Fluoxetine capsules or tablets (Depression/Mood Disorders) What is this medicine? FLUOXETINE (floo OX e teen) belongs to a class of drugs known as selective serotonin reuptake inhibitors (SSRIs). It helps to treat mood problems such as depression, obsessive compulsive disorder, and panic attacks. It can also treat certain eating disorders. This medicine may be used for other purposes; ask your health care provider or pharmacist if you have questions. COMMON BRAND NAME(S): Prozac What should I tell my health care provider before I take this medicine? They need to know if you have any of these conditions: -bipolar disorder or a family history of bipolar disorder -bleeding disorders -glaucoma -heart disease -liver disease -low levels of sodium in the blood -seizures -suicidal thoughts, plans, or attempt; a previous suicide attempt by you or a family member -take MAOIs like Carbex, Eldepryl, Marplan, Nardil, and Parnate -take medicines that treat or prevent blood clots -thyroid disease -an unusual or allergic reaction to fluoxetine, other medicines, foods, dyes, or preservatives -pregnant or trying to get pregnant -breast-feeding How should I use this medicine? Take this medicine by mouth with a glass of water. Follow the directions on the prescription label. You can take this medicine with or without food. Take your medicine at regular intervals. Do not take it more often than directed. Do not stop taking this medicine suddenly except upon the advice of your doctor. Stopping this medicine too quickly may cause serious side effects or your condition may worsen. A special MedGuide will be given to you by the pharmacist with each prescription and refill. Be sure to read this information carefully each time. Talk to your pediatrician regarding the use of this medicine in children. While this drug may be prescribed for children as young as 7 years for selected conditions, precautions do  apply. Overdosage: If you think you have taken too much of this medicine contact a poison control center or emergency room at once. NOTE: This medicine is only for you. Do not share this medicine with others. What if I miss a dose? If you miss a dose, skip the missed dose and go back to your regular dosing schedule. Do not take double or extra doses. What may interact with this medicine? Do not take this medicine with any of the following medications: -other medicines containing fluoxetine, like Sarafem or Symbyax -cisapride -linezolid -MAOIs like Carbex, Eldepryl, Marplan, Nardil, and Parnate -methylene blue (injected into a vein) -pimozide -thioridazine This medicine may also interact with the following medications: -alcohol -amphetamines -aspirin and aspirin-like medicines -carbamazepine -certain medicines for depression, anxiety, or psychotic disturbances -certain medicines for migraine headaches like almotriptan, eletriptan, frovatriptan, naratriptan, rizatriptan, sumatriptan, zolmitriptan -digoxin -diuretics -fentanyl -flecainide -furazolidone -isoniazid -lithium -medicines for sleep -medicines that treat or prevent blood clots like warfarin, enoxaparin, and dalteparin -NSAIDs, medicines for pain and inflammation, like ibuprofen or naproxen -phenytoin -procarbazine -propafenone -rasagiline -ritonavir -supplements like St. John's wort, kava kava, valerian -tramadol -tryptophan -vinblastine This list may not describe all possible interactions. Give your health care provider a list of all the medicines, herbs, non-prescription drugs, or dietary supplements you use. Also tell them if you smoke, drink alcohol, or use illegal drugs. Some items may interact with your medicine. What should I watch for while using this medicine? Tell your doctor if your symptoms do not get better or if they get worse. Visit your doctor or health care professional for regular checks on your  progress. Because it may take several weeks to see the full effects of this medicine, it   is important to continue your treatment as prescribed by your doctor. Patients and their families should watch out for new or worsening thoughts of suicide or depression. Also watch out for sudden changes in feelings such as feeling anxious, agitated, panicky, irritable, hostile, aggressive, impulsive, severely restless, overly excited and hyperactive, or not being able to sleep. If this happens, especially at the beginning of treatment or after a change in dose, call your health care professional. You may get drowsy or dizzy. Do not drive, use machinery, or do anything that needs mental alertness until you know how this medicine affects you. Do not stand or sit up quickly, especially if you are an older patient. This reduces the risk of dizzy or fainting spells. Alcohol may interfere with the effect of this medicine. Avoid alcoholic drinks. Your mouth may get dry. Chewing sugarless gum or sucking hard candy, and drinking plenty of water may help. Contact your doctor if the problem does not go away or is severe. This medicine may affect blood sugar levels. If you have diabetes, check with your doctor or health care professional before you change your diet or the dose of your diabetic medicine. What side effects may I notice from receiving this medicine? Side effects that you should report to your doctor or health care professional as soon as possible: -allergic reactions like skin rash, itching or hives, swelling of the face, lips, or tongue -anxious -black, tarry stools -breathing problems -changes in vision -confusion -elevated mood, decreased need for sleep, racing thoughts, impulsive behavior -eye pain -fast, irregular heartbeat -feeling faint or lightheaded, falls -feeling agitated, angry, or irritable -hallucination, loss of contact with reality -loss of balance or coordination -loss of memory -painful  or prolonged erections -restlessness, pacing, inability to keep still -seizures -stiff muscles -suicidal thoughts or other mood changes -trouble sleeping -unusual bleeding or bruising -unusually weak or tired -vomiting Side effects that usually do not require medical attention (report to your doctor or health care professional if they continue or are bothersome): -change in appetite or weight -change in sex drive or performance -diarrhea -dry mouth -headache -increased sweating -nausea -tremors This list may not describe all possible side effects. Call your doctor for medical advice about side effects. You may report side effects to FDA at 1-800-FDA-1088. Where should I keep my medicine? Keep out of the reach of children. Store at room temperature between 15 and 30 degrees C (59 and 86 degrees F). Throw away any unused medicine after the expiration date. NOTE: This sheet is a summary. It may not cover all possible information. If you have questions about this medicine, talk to your doctor, pharmacist, or health care provider.  2018 Elsevier/Gold Standard (2015-06-11 15:55:27)  

## 2017-09-09 ENCOUNTER — Other Ambulatory Visit: Payer: Self-pay | Admitting: Psychiatry

## 2017-09-09 DIAGNOSIS — F331 Major depressive disorder, recurrent, moderate: Secondary | ICD-10-CM

## 2017-09-09 DIAGNOSIS — F5105 Insomnia due to other mental disorder: Secondary | ICD-10-CM

## 2017-09-12 ENCOUNTER — Ambulatory Visit
Payer: Medicare HMO | Attending: Student in an Organized Health Care Education/Training Program | Admitting: Student in an Organized Health Care Education/Training Program

## 2017-09-13 ENCOUNTER — Telehealth: Payer: Self-pay

## 2017-09-13 DIAGNOSIS — F5105 Insomnia due to other mental disorder: Secondary | ICD-10-CM

## 2017-09-13 DIAGNOSIS — F331 Major depressive disorder, recurrent, moderate: Secondary | ICD-10-CM

## 2017-09-13 MED ORDER — ZOLPIDEM TARTRATE 5 MG PO TABS: 5.0000 mg | ORAL_TABLET | Freq: Every evening | ORAL | 0 refills | Status: DC | PRN

## 2017-09-13 NOTE — Telephone Encounter (Signed)
pt states the pharmacy does not have rx for [employer]

## 2017-09-13 NOTE — Telephone Encounter (Signed)
Received request from CMA that pharmacy had trouble with getting Ambien filled due to a transition from [facility] to [facility] and difficulty with transferring scripts at that location. Will re- sent script again to [facility].

## 2017-09-13 NOTE — Telephone Encounter (Signed)
  called pharmacy and they states that they see the rx but something about that they did not started until 09-06-17 taking over from [facility]. he was told that it wasnt sent to [facility] it was sent to [facility] but he still stated that they need a new rx.     zolpidem (AMBIEN) 5 MG tablet  Medication  Date: 09/03/2017 Department: [facility] Ordering/Authorizing: Jomarie LongsEappen, Saramma, MD  Order Providers   Prescribing Provider Encounter Provider  Jomarie LongsEappen, Saramma, MD Jomarie LongsEappen, Saramma, MD  Outpatient Medication Detail    Disp Refills Start End   zolpidem (AMBIEN) 5 MG tablet 90 tablet 0 09/03/2017    Sig - Route: Take 1 tablet (5 mg total) by mouth at bedtime as needed for sleep. - Oral   Sent to pharmacy as: zolpidem (AMBIEN) 5 MG tablet   E-Prescribing Status: Receipt confirmed by pharmacy (09/03/2017 9:25 AM EDT)

## 2017-09-17 DIAGNOSIS — Z01419 Encounter for gynecological examination (general) (routine) without abnormal findings: Secondary | ICD-10-CM | POA: Diagnosis not present

## 2017-09-18 ENCOUNTER — Encounter: Payer: Self-pay | Admitting: Student in an Organized Health Care Education/Training Program

## 2017-09-18 ENCOUNTER — Ambulatory Visit
Payer: Medicare HMO | Attending: Student in an Organized Health Care Education/Training Program | Admitting: Student in an Organized Health Care Education/Training Program

## 2017-09-18 ENCOUNTER — Other Ambulatory Visit: Payer: Self-pay

## 2017-09-18 VITALS — BP 117/82 | HR 110 | Temp 98.4°F | Resp 16 | Ht 67.0 in | Wt 220.0 lb

## 2017-09-18 DIAGNOSIS — M791 Myalgia, unspecified site: Secondary | ICD-10-CM | POA: Diagnosis not present

## 2017-09-18 DIAGNOSIS — E119 Type 2 diabetes mellitus without complications: Secondary | ICD-10-CM | POA: Insufficient documentation

## 2017-09-18 DIAGNOSIS — F331 Major depressive disorder, recurrent, moderate: Secondary | ICD-10-CM

## 2017-09-18 DIAGNOSIS — Z5181 Encounter for therapeutic drug level monitoring: Secondary | ICD-10-CM | POA: Insufficient documentation

## 2017-09-18 DIAGNOSIS — G8929 Other chronic pain: Secondary | ICD-10-CM | POA: Diagnosis not present

## 2017-09-18 DIAGNOSIS — Z7982 Long term (current) use of aspirin: Secondary | ICD-10-CM | POA: Insufficient documentation

## 2017-09-18 DIAGNOSIS — I7300 Raynaud's syndrome without gangrene: Secondary | ICD-10-CM | POA: Insufficient documentation

## 2017-09-18 DIAGNOSIS — G4733 Obstructive sleep apnea (adult) (pediatric): Secondary | ICD-10-CM | POA: Diagnosis not present

## 2017-09-18 DIAGNOSIS — M545 Low back pain: Secondary | ICD-10-CM | POA: Diagnosis not present

## 2017-09-18 DIAGNOSIS — Z7984 Long term (current) use of oral hypoglycemic drugs: Secondary | ICD-10-CM | POA: Diagnosis not present

## 2017-09-18 DIAGNOSIS — E538 Deficiency of other specified B group vitamins: Secondary | ICD-10-CM | POA: Diagnosis not present

## 2017-09-18 DIAGNOSIS — G43719 Chronic migraine without aura, intractable, without status migrainosus: Secondary | ICD-10-CM | POA: Diagnosis not present

## 2017-09-18 DIAGNOSIS — E785 Hyperlipidemia, unspecified: Secondary | ICD-10-CM | POA: Diagnosis not present

## 2017-09-18 DIAGNOSIS — Z79899 Other long term (current) drug therapy: Secondary | ICD-10-CM | POA: Diagnosis not present

## 2017-09-18 DIAGNOSIS — G894 Chronic pain syndrome: Secondary | ICD-10-CM

## 2017-09-18 DIAGNOSIS — F419 Anxiety disorder, unspecified: Secondary | ICD-10-CM | POA: Insufficient documentation

## 2017-09-18 DIAGNOSIS — G7100 Muscular dystrophy, unspecified: Secondary | ICD-10-CM | POA: Diagnosis not present

## 2017-09-18 DIAGNOSIS — M1611 Unilateral primary osteoarthritis, right hip: Secondary | ICD-10-CM | POA: Insufficient documentation

## 2017-09-18 DIAGNOSIS — M25561 Pain in right knee: Secondary | ICD-10-CM | POA: Diagnosis not present

## 2017-09-18 DIAGNOSIS — E559 Vitamin D deficiency, unspecified: Secondary | ICD-10-CM | POA: Insufficient documentation

## 2017-09-18 MED ORDER — CELECOXIB 200 MG PO CAPS: 200.0000 mg | ORAL_CAPSULE | Freq: Every day | ORAL | 0 refills | Status: AC

## 2017-09-18 MED ORDER — PREGABALIN 100 MG PO CAPS: ORAL_CAPSULE | ORAL | 4 refills | Status: DC

## 2017-09-18 NOTE — Progress Notes (Signed)
Patient's Name: Meredith Williams  MRN: [id number]  Referring Provider: Linthavong, Kanhka, MD  DOB: 06/09/1980  PCP: Linthavong, Kanhka, MD  DOS: 09/18/2017  Note by:  , MD  Service setting: Ambulatory outpatient  Specialty: Interventional Pain Management  Location: [facility] (AMB) Pain Management Facility    Patient type: Established   Primary Reason(s) for Visit: Encounter for prescription drug management. (Level of risk: moderate)  CC: Back Pain (lower) and Knee Pain (right)  HPI  Meredith Williams is a 37 y.o. year old, female patient, who comes today for a medication management evaluation. She has Acute pancreatitis; Pancreatitis; Insomnia; Hypertriglyceridemia; Encounter for therapeutic drug monitoring; Diabetes mellitus without complication (HCC); Depression; Chronic pain syndrome; Anxiety; Cocaine use; Intractable chronic migraine without aura and without status migrainosus; Raynaud phenomenon; Primary osteoarthritis of right hip; Myotonic muscular dystrophy (HCC); Raynaud's syndrome without gangrene; Vitamin D deficiency; Vitamin B12 deficiency; Right hip impingement syndrome; Muscular dystrophy (HCC); Chronic bilateral low back pain without sciatica; Myalgia; MDD (major depressive disorder), recurrent episode, moderate (HCC); Sweating abnormality; and OSA (obstructive sleep apnea) on their problem list. Her primarily concern today is the Back Pain (lower) and Knee Pain (right)  Pain Assessment: Location: Lower Back Radiating: denies Onset: More than a month ago Duration: Chronic pain Quality: Constant, Aching, Throbbing Severity: 10-Worst pain ever/10 (subjective, self-reported pain score)  Note: Reported level is inconsistent with clinical observations. Clinically the patient looks like a 2/10 A 2/10 is viewed as "Mild to Moderate" and described as noticeable and distracting. Impossible to hide from other people. More frequent flare-ups. Still possible to adapt and function close to normal. It can  be very annoying and may have occasional stronger flare-ups. With discipline, patients may get used to it and adapt. Information on the proper use of the pain scale provided to the patient today. When using our objective Pain Scale, levels between 6 and 10/10 are said to belong in an emergency room, as it progressively worsens from a 6/10, described as severely limiting, requiring emergency care not usually available at an outpatient pain management facility. At a 6/10 level, communication becomes difficult and requires great effort. Assistance to reach the emergency department may be required. Facial flushing and profuse sweating along with potentially dangerous increases in heart rate and blood pressure will be evident. Effect on ADL: lays down a lot Timing: Constant Modifying factors: laying down BP: 117/82  HR: (!) 110  Meredith Williams was last scheduled for an appointment on 09/12/2017 for medication management. During today's appointment we reviewed Ms. Helling's chronic pain status, as well as her outpatient medication regimen.  Increased low back pain that started approximately last week.  No inciting event.  No bowel or bladder dysfunction.  Here for medication refill of Lyrica  The patient  reports that she does not use drugs. Her body mass index is 34.46 kg/m.  Further details on both, my assessment(s), as well as the proposed treatment plan, please see below.   Laboratory Chemistry  Inflammation Markers (CRP: Acute Phase) (ESR: Chronic Phase) Lab Results  Component Value Date   LATICACIDVEN 0.9 06/15/2016                         Rheumatology Markers No results found for: RF, ANA, LABURIC, URICUR, LYMEIGGIGMAB, LYMEABIGMQN, HLAB27                      Renal Function Markers Lab Results  Component Value Date   BUN   17 11/13/2016   CREATININE 1.06 (H) 11/13/2016   GFRAA >60 11/13/2016   GFRNONAA >60 11/13/2016                             Hepatic Function Markers Lab Results   Component Value Date   AST 25 11/13/2016   ALT 31 11/13/2016   ALBUMIN 4.4 11/13/2016   ALKPHOS 38 11/13/2016   LIPASE 25 11/13/2016                        Electrolytes Lab Results  Component Value Date   NA 136 11/13/2016   K 5.2 (H) 11/13/2016   CL 99 (L) 11/13/2016   CALCIUM 9.6 11/13/2016   MG 2.4 06/22/2016   PHOS 2.7 06/22/2016                        Neuropathy Markers Lab Results  Component Value Date   HIV Non Reactive 06/16/2016                        Bone Pathology Markers No results found for: VD25OH, VD125OH2TOT, VD3125OH2, VD2125OH2, 25OHVITD1, 25OHVITD2, 25OHVITD3, TESTOFREE, TESTOSTERONE                       Coagulation Parameters Lab Results  Component Value Date   PLT 382 11/13/2016                        Cardiovascular Markers Lab Results  Component Value Date   TROPONINI <0.03 06/15/2016   HGB 16.3 (H) 11/13/2016   HCT 48.9 (H) 11/13/2016                         CA Markers No results found for: CEA, CA125, LABCA2                      Note: Lab results reviewed.  Recent Diagnostic Imaging Results  CT ABDOMEN PELVIS W CONTRAST CLINICAL DATA:  Severe mid abdominal pain, nausea and vomiting since 11/15/2016.  EXAM: CT ABDOMEN AND PELVIS WITH CONTRAST  TECHNIQUE: Multidetector CT imaging of the abdomen and pelvis was performed using the standard protocol following bolus administration of intravenous contrast.  CONTRAST:  100 cc Isovue 370 IV  COMPARISON:  06/23/2016  FINDINGS: Lower chest: Normal size heart without pericardial effusion. Linear platelike atelectasis and/or scarring within each lower lobe with subsegmental atelectasis in the lingula and left lower lobe as well. No dominant mass, pneumonic consolidation or effusion.  Hepatobiliary: Hypodense appearance of the liver consistent with hepatic steatosis. No space-occupying mass. Normal gallbladder without stones. No biliary dilatation.  Pancreas: Mild fatty  infiltration of the pancreatic head and uncinate process. No dominant mass or ductal dilatation. No inflammation.  Spleen: Normal  Adrenals/Urinary Tract: Normal bilateral adrenal glands. Atrophic left kidney with compensatory hypertrophy of the right. Retroaortic left renal vein. No obstructive uropathy. Decompressed urinary bladder without focal mural thickening or calculus.  Stomach/Bowel: Go physiologic distention of the stomach with enteric contrast. There is normal small bowel rotation and ligament of Treitz position. No small bowel obstruction or inflammation. Normal-appearing appendix. An average amount of stool is noted within the colon. No acute large bowel abnormality.  Vascular/Lymphatic: No aortic aneurysm or dissection. No lymphadenopathy.  Reproductive: Status post hysterectomy. Peripherally   enhancing 19 mm ruptured cyst or follicle of the right ovary with associated small amount of free fluid in the cul-de-sac.  Other: No abdominal wall hernia.  Musculoskeletal: No acute or significant osseous findings.  IMPRESSION: 1. Irregular peripherally enhancing 19 mm right ovarian cyst or follicle with associated free fluid in the cul-de-sac. Findings may reflect a ruptured/hemorrhagic cyst or corpus luteum. 2. Atrophic left kidney with compensatory hypertrophy of the right kidney as before. No obstructive uropathy. 3. No acute bowel obstruction or inflammation. 4. Unremarkable appearance of the pancreas with resolved peripancreatic inflammation since prior exam. No pseudocyst formation or ductal dilatation. It should be noted that the pancreas can appear normal on CT despite clinical pancreatitis and therefore laboratory correlation is recommended.  Electronically Signed   By: David  Kwon M.D.   On: 11/20/2016 14:35  Complexity Note: Imaging results reviewed. Results shared with Ms. Bufano, using Layman's terms.                         Meds   Current Outpatient  Medications:  .  aspirin EC 81 MG tablet, Take 81 mg by mouth daily., Disp: , Rfl:  .  cyclobenzaprine (FLEXERIL) 10 MG tablet, Take 15 mg by mouth 2 (two) times daily as needed for muscle spasms., Disp: , Rfl: 0 .  fenofibrate 160 MG tablet, Take 160 mg by mouth daily., Disp: , Rfl: 0 .  FLUoxetine (PROZAC) 20 MG capsule, Take 1-2 capsules (20-40 mg total) by mouth daily. Take 1 capsule for 2 weeks and then start taking 2 caps, Disp: 60 capsule, Rfl: 0 .  hydrOXYzine (VISTARIL) 25 MG capsule, Take 1 capsule (25 mg total) by mouth 3 (three) times daily as needed for anxiety., Disp: 270 capsule, Rfl: 0 .  metFORMIN (GLUCOPHAGE) 1000 MG tablet, , Disp: , Rfl: 0 .  niacin (NIASPAN) 500 MG CR tablet, Take by mouth., Disp: , Rfl:  .  pregabalin (LYRICA) 100 MG capsule, 100 mg in the qAM, 200 mg qPM, Disp: 90 capsule, Rfl: 4 .  zolpidem (AMBIEN) 5 MG tablet, Take 1 tablet (5 mg total) by mouth at bedtime as needed for sleep., Disp: 90 tablet, Rfl: 0 .  celecoxib (CELEBREX) 200 MG capsule, Take 1 capsule (200 mg total) by mouth daily after lunch for 21 days., Disp: 21 capsule, Rfl: 0 .  JUNEL FE 24 1-20 MG-MCG(24) tablet, Take 1 tablet by mouth daily., Disp: , Rfl: 0 .  norgestrel-ethinyl estradiol (OGESTREL) 0.5-50 MG-MCG tablet, Take by mouth., Disp: , Rfl:  .  nortriptyline (PAMELOR) 50 MG capsule, Take 50 mg by mouth at bedtime., Disp: , Rfl: 0 .  triamcinolone cream (KENALOG) 0.5 %, Apply topically., Disp: , Rfl:   ROS  Constitutional: Denies any fever or chills Gastrointestinal: No reported hemesis, hematochezia, vomiting, or acute GI distress Musculoskeletal: Denies any acute onset joint swelling, redness, loss of ROM, or weakness Neurological: No reported episodes of acute onset apraxia, aphasia, dysarthria, agnosia, amnesia, paralysis, loss of coordination, or loss of consciousness  Allergies  Ms. Castell is allergic to other.  PFSH  Drug: Ms. Smits  reports that she does not use  drugs. Alcohol:  reports that she does not drink alcohol. Tobacco:  reports that she is a non-smoker but has been exposed to tobacco smoke. She has never used smokeless tobacco. Medical:  has a past medical history of Anxiety, Depression, Diabetes mellitus without complication (HCC), Hyperlipidemia, Muscular dystrophy, myotonic (HCC), and Pancreatitis. Surgical: Ms.   Eriksson  has a past surgical history that includes Abdominal hysterectomy and Hip surgery (Right, 2016). Family: family history includes Anxiety disorder in her sister; Depression in her sister and sister; Drug abuse in her brother; Muscular dystrophy in her mother and sister; Stroke in her mother.  Constitutional Exam  General appearance: Well nourished, well developed, and well hydrated. In no apparent acute distress Vitals:   09/18/17 1040  BP: 117/82  Pulse: (!) 110  Resp: 16  Temp: 98.4 F (36.9 C)  TempSrc: Oral  SpO2: 95%  Weight: 220 lb (99.8 kg)  Height: 5' 7" (1.702 m)   BMI Assessment: Estimated body mass index is 34.46 kg/m as calculated from the following:   Height as of this encounter: 5' 7" (1.702 m).   Weight as of this encounter: 220 lb (99.8 kg).  BMI interpretation table: BMI level Category Range association with higher incidence of chronic pain  <18 kg/m2 Underweight   18.5-24.9 kg/m2 Ideal body weight   25-29.9 kg/m2 Overweight Increased incidence by 20%  30-34.9 kg/m2 Obese (Class I) Increased incidence by 68%  35-39.9 kg/m2 Severe obesity (Class II) Increased incidence by 136%  >40 kg/m2 Extreme obesity (Class III) Increased incidence by 254%   Patient's current BMI Ideal Body weight  Body mass index is 34.46 kg/m. Ideal body weight: 61.6 kg (135 lb 12.9 oz) Adjusted ideal body weight: 76.9 kg (169 lb 7.7 oz)   BMI Readings from Last 4 Encounters:  09/18/17 34.46 kg/m  08/06/17 35.51 kg/m  04/11/17 35.51 kg/m  01/17/17 33.28 kg/m   Wt Readings from Last 4 Encounters:  09/18/17 220 lb  (99.8 kg)  08/06/17 220 lb (99.8 kg)  04/11/17 220 lb (99.8 kg)  01/17/17 200 lb (90.7 kg)  Psych/Mental status: Alert, oriented x 3 (person, place, & time)       Eyes: PERLA Respiratory: No evidence of acute respiratory distress  Cervical Spine Area Exam  Skin & Axial Inspection: No masses, redness, edema, swelling, or associated skin lesions Alignment: Symmetrical Functional ROM: Unrestricted ROM      Stability: No instability detected Muscle Tone/Strength: Functionally intact. No obvious neuro-muscular anomalies detected. Sensory (Neurological): Unimpaired Palpation: No palpable anomalies              Upper Extremity (UE) Exam    Side: Right upper extremity  Side: Left upper extremity  Skin & Extremity Inspection: Skin color, temperature, and hair growth are WNL. No peripheral edema or cyanosis. No masses, redness, swelling, asymmetry, or associated skin lesions. No contractures.  Skin & Extremity Inspection: Skin color, temperature, and hair growth are WNL. No peripheral edema or cyanosis. No masses, redness, swelling, asymmetry, or associated skin lesions. No contractures.  Functional ROM: Unrestricted ROM          Functional ROM: Unrestricted ROM          Muscle Tone/Strength: Functionally intact. No obvious neuro-muscular anomalies detected.  Muscle Tone/Strength: Functionally intact. No obvious neuro-muscular anomalies detected.  Sensory (Neurological): Unimpaired          Sensory (Neurological): Unimpaired          Palpation: No palpable anomalies              Palpation: No palpable anomalies              Provocative Test(s):  Phalen's test: deferred Tinel's test: deferred Apley's scratch test (touch opposite shoulder):  Action 1 (Across chest): deferred Action 2 (Overhead): deferred Action 3 (LB reach): deferred  Provocative Test(s):  Phalen's test: deferred Tinel's test: deferred Apley's scratch test (touch opposite shoulder):  Action 1 (Across chest): deferred Action  2 (Overhead): deferred Action 3 (LB reach): deferred    Thoracic Spine Area Exam  Skin & Axial Inspection: No masses, redness, or swelling Alignment: Symmetrical Functional ROM: Unrestricted ROM Stability: No instability detected Muscle Tone/Strength: Functionally intact. No obvious neuro-muscular anomalies detected. Sensory (Neurological): Unimpaired Muscle strength & Tone: No palpable anomalies  Lumbar Spine Area Exam  Skin & Axial Inspection: No masses, redness, or swelling Alignment: Symmetrical Functional ROM: Pain restricted ROM       Stability: No instability detected Muscle Tone/Strength: Functionally intact. No obvious neuro-muscular anomalies detected. Sensory (Neurological): Musculoskeletal pain pattern Palpation: No palpable anomalies       Provocative Tests: Hyperextension/rotation test: deferred today       Lumbar quadrant test (Kemp's test): deferred today       Lateral bending test: deferred today       Patrick's Maneuver: deferred today                   FABER test: deferred today                   S-I anterior distraction/compression test: deferred today         S-I lateral compression test: deferred today         S-I Thigh-thrust test: deferred today         S-I Gaenslen's test: deferred today          Gait & Posture Assessment  Ambulation: Unassisted Gait: Relatively normal for age and body habitus Posture: WNL   Lower Extremity Exam    Side: Right lower extremity  Side: Left lower extremity  Stability: No instability observed          Stability: No instability observed          Skin & Extremity Inspection: Skin color, temperature, and hair growth are WNL. No peripheral edema or cyanosis. No masses, redness, swelling, asymmetry, or associated skin lesions. No contractures.  Skin & Extremity Inspection: Skin color, temperature, and hair growth are WNL. No peripheral edema or cyanosis. No masses, redness, swelling, asymmetry, or associated skin lesions. No  contractures.  Functional ROM: Unrestricted ROM                  Functional ROM: Unrestricted ROM                  Muscle Tone/Strength: Functionally intact. No obvious neuro-muscular anomalies detected.  Muscle Tone/Strength: Functionally intact. No obvious neuro-muscular anomalies detected.  Sensory (Neurological): Unimpaired  Sensory (Neurological): Unimpaired  Palpation: No palpable anomalies  Palpation: No palpable anomalies   Assessment  Primary Diagnosis & Pertinent Problem List: The primary encounter diagnosis was Chronic pain syndrome. Diagnoses of Muscular dystrophy ([place]), Chronic bilateral low back pain without sciatica, Myalgia, MDD (major depressive disorder), recurrent episode, moderate ([place]), and Intractable chronic migraine without aura and without status migrainosus were also pertinent to this visit.  Status Diagnosis  Persistent Persistent Persistent 1. Chronic pain syndrome   2. Muscular dystrophy ([place])   3. Chronic bilateral low back pain without sciatica   4. Myalgia   5. MDD (major depressive disorder), recurrent episode, moderate ([place])   6. Intractable chronic migraine without aura and without status migrainosus     General Recommendations: The pain condition that the patient suffers from is best treated with a multidisciplinary approach that involves an  increase in physical activity to prevent de-conditioning and worsening of the pain cycle, as well as psychological counseling (formal and/or informal) to address the co-morbid psychological affects of pain. Treatment will often involve judicious use of pain medications and interventional procedures to decrease the pain, allowing the patient to participate in the physical activity that will ultimately produce long-lasting pain reductions. The goal of the multidisciplinary approach is to return the patient to a higher level of overall function and to restore their ability to perform activities of daily  living.   37 year old female with a history of chronic pain syndrome secondary to muscular dystrophy who presents for medication management.  Patient overall has been doing well and is stable from a chronic pain standpoint.  Patient is endorsing worsening low back pain likely secondary to musculoskeletal etiology related to her muscular dystrophy.  Did not find benefit with diclofenac.  Will trial Celebrex short-term as below.  We will also refill patient's Lyrica as below.  Plan of Care  Pharmacotherapy (Medications Ordered): Meds ordered this encounter  Medications  . DISCONTD: pregabalin (LYRICA) 100 MG capsule    Sig: 100 mg in the qAM, 200 mg qPM    Dispense:  90 capsule    Refill:  4    Do not place this medication, or any other prescription from our practice, on "Automatic Refill". Patient may have prescription filled one day early if pharmacy is closed on scheduled refill date.  . celecoxib (CELEBREX) 200 MG capsule    Sig: Take 1 capsule (200 mg total) by mouth daily after lunch for 21 days.    Dispense:  21 capsule    Refill:  0    Do not place medication on "Automatic Refill". Fill one day early if pharmacy is closed on scheduled refill date.  . pregabalin (LYRICA) 100 MG capsule    Sig: 100 mg in the qAM, 200 mg qPM    Dispense:  90 capsule    Refill:  4    Do not place this medication, or any other prescription from our practice, on "Automatic Refill". Patient may have prescription filled one day early if pharmacy is closed on scheduled refill date.   Provider-requested follow-up: Return in about 5 months (around 02/18/2018) for Medication Management.  Future Appointments  Date Time Provider [place]  09/26/2017  8:00 AM Lubertha South, LCSW [facility] None  09/26/2017  9:45 AM Ursula Alert, MD [facility] None  02/18/2018  8:30 AM Gillis Santa, MD [facility] None   Time Note: Greater than 50% of the 25 minute(s) of face-to-face time spent with Ms. Paterson, was spent  in counseling/coordination of care regarding: Ms. Lohr primary cause of pain, the treatment plan, treatment alternatives, medication side effects, going over the informed consent, the appropriate use of her medications, realistic expectations, the goals of pain management (increased in functionality) and the need to bring and keep the BMI below 30.  Primary Care Physician: Dion Body, MD Location[facility] Note by: Gillis Santa, M.D Date: 09/18/2017; Time: 7:27 PM  Patient Instructions  Refill of Lyrica which is been prescribed for 4 months  Stop all NSAIDs including ibuprofen, naproxen, Goody's powder.  Prescription for Celebrex for 21 days for acute low back pain.  Be sure to take after meal.

## 2017-09-18 NOTE — Progress Notes (Signed)
Safety precautions to be maintained throughout the outpatient stay will include: orient to surroundings, keep bed in low position, maintain call bell within reach at all times, provide assistance with transfer out of bed and ambulation.  

## 2017-09-18 NOTE — Patient Instructions (Signed)
Refill of Lyrica which is been prescribed for 4 months  Stop all NSAIDs including ibuprofen, naproxen, Goody's powder.  Prescription for Celebrex for 21 days for acute low back pain.  Be sure to take after meal.

## 2017-09-19 ENCOUNTER — Other Ambulatory Visit: Payer: Self-pay | Admitting: Psychiatry

## 2017-09-19 DIAGNOSIS — F411 Generalized anxiety disorder: Secondary | ICD-10-CM

## 2017-09-19 DIAGNOSIS — G4733 Obstructive sleep apnea (adult) (pediatric): Secondary | ICD-10-CM | POA: Diagnosis not present

## 2017-09-19 DIAGNOSIS — F331 Major depressive disorder, recurrent, moderate: Secondary | ICD-10-CM

## 2017-09-19 DIAGNOSIS — F4010 Social phobia, unspecified: Secondary | ICD-10-CM

## 2017-09-19 MED ORDER — FLUOXETINE HCL 40 MG PO CAPS: 40.0000 mg | ORAL_CAPSULE | Freq: Every day | ORAL | 1 refills | Status: DC

## 2017-09-19 NOTE — Telephone Encounter (Signed)
Sent Prozac 40 mg to pharmacy 

## 2017-09-24 ENCOUNTER — Other Ambulatory Visit: Payer: Self-pay | Admitting: Psychiatry

## 2017-09-25 DIAGNOSIS — G894 Chronic pain syndrome: Secondary | ICD-10-CM | POA: Diagnosis not present

## 2017-09-25 DIAGNOSIS — G43719 Chronic migraine without aura, intractable, without status migrainosus: Secondary | ICD-10-CM | POA: Diagnosis not present

## 2017-09-25 DIAGNOSIS — G4733 Obstructive sleep apnea (adult) (pediatric): Secondary | ICD-10-CM | POA: Diagnosis not present

## 2017-09-25 DIAGNOSIS — G7111 Myotonic muscular dystrophy: Secondary | ICD-10-CM | POA: Diagnosis not present

## 2017-09-26 ENCOUNTER — Ambulatory Visit (INDEPENDENT_AMBULATORY_CARE_PROVIDER_SITE_OTHER): Payer: Medicare HMO | Admitting: Psychiatry

## 2017-09-26 ENCOUNTER — Encounter: Payer: Self-pay | Admitting: Psychiatry

## 2017-09-26 ENCOUNTER — Other Ambulatory Visit: Payer: Self-pay

## 2017-09-26 ENCOUNTER — Ambulatory Visit (INDEPENDENT_AMBULATORY_CARE_PROVIDER_SITE_OTHER): Payer: Medicare HMO | Admitting: Licensed Clinical Social Worker

## 2017-09-26 ENCOUNTER — Other Ambulatory Visit: Payer: Self-pay | Admitting: Psychiatry

## 2017-09-26 VITALS — BP 133/90 | HR 116 | Temp 97.7°F | Wt 223.0 lb

## 2017-09-26 DIAGNOSIS — F4010 Social phobia, unspecified: Secondary | ICD-10-CM

## 2017-09-26 DIAGNOSIS — F331 Major depressive disorder, recurrent, moderate: Secondary | ICD-10-CM | POA: Diagnosis not present

## 2017-09-26 DIAGNOSIS — F411 Generalized anxiety disorder: Secondary | ICD-10-CM

## 2017-09-26 DIAGNOSIS — F5105 Insomnia due to other mental disorder: Secondary | ICD-10-CM

## 2017-09-26 MED ORDER — PROPRANOLOL HCL 10 MG PO TABS: 10.0000 mg | ORAL_TABLET | Freq: Three times a day (TID) | ORAL | 1 refills | Status: DC | PRN

## 2017-09-26 NOTE — Progress Notes (Signed)
[facility] MD  OP Progress Note  09/26/2017 5:31 PM Meredith Williams  MRN:  [id number]  Chief Complaint: ' I am here for follow up.'  Chief Complaint    Follow-up; Medication Refill     HPI: Meredith Williams is a 37 year old Caucasian female, single, lives in [place], on [place], presented to the clinic today for a follow-up visit.  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  Patient today reports that her sister passed away recently.  She reports her sister had surgery and was discharged home however couple of days later she was found dead.  She reports that she and her family are waiting for autopsy report.  Patient reports overall she has been coping okay.  She however reports she is worried about her mother who is not doing so well.  She reports she met with Ms. Peacock for therapy this morning.  Discussed more frequent psychotherapy sessions also to deal with her grief.  She agreed with plan.  She reports she started taking the Prozac 40 mg.  She reports she felt wobbly or tired when she took it in the morning and hence has been taking it at bedtime.  She reports it has not affected her sleep and she continues to sleep well with the Ambien.  She also uses CPAP which helps her sleep.  She reports she continues to have anxiety symptoms on and off and has been making use of hydroxyzine.  She however reports hydroxyzine has not been helpful.  Discussed adding propranolol as needed and she agrees with plan.  Patient denies any suicidality.  Patient denies any perceptual disturbances.  Patient denies any other concerns today. Visit Diagnosis:    ICD-10-CM   1. MDD (major depressive disorder), recurrent episode, moderate (HCC) F33.1   2. Social anxiety disorder F40.10 DISCONTINUED: propranolol (INDERAL) 10 MG tablet  3. GAD (generalized anxiety disorder) F41.1 DISCONTINUED: propranolol (INDERAL) 10 MG tablet  4. Insomnia due to mental  disorder F51.05     Past Psychiatric History: I have reviewed past psychiatric history from my progress note on 04/16/2017.  Past trials of Cymbalta-ineffective, Viibryd, BuSpar.  Past Medical History:  Past Medical History:  Diagnosis Date  . Anxiety   . Depression   . Diabetes mellitus without complication ([place])   . Hyperlipidemia   . Muscular dystrophy, myotonic ([place])   . Pancreatitis     Past Surgical History:  Procedure Laterality Date  . ABDOMINAL HYSTERECTOMY    . HIP SURGERY Right 2016   "removal of a bone growth"    Family Psychiatric History: Have reviewed family psychiatric history from my progress note on 04/16/2017  Family History:  Family History  Problem Relation Age of Onset  . Stroke Mother   . Muscular dystrophy Mother   . Drug abuse Brother   . Depression Sister   . Anxiety disorder Sister   . Muscular dystrophy Sister   . Depression Sister     Social History: Have reviewed social history from my progress note on 04/16/2017. Social History   Socioeconomic History  . Marital status: Single    Spouse name: Not on file  . Number of children: 0  . Years of education: Not on file  . Highest education level: Some college, no degree  Occupational History    Comment: disability  Social Needs  . Financial resource strain: Not hard at all  . Food insecurity:    Worry: Never true  Inability: Never true  . Transportation needs:    Medical: Yes    Non-medical: Yes  Tobacco Use  . Smoking status: Passive Smoke Exposure - Never Smoker  . Smokeless tobacco: Never Used  Substance and Sexual Activity  . Alcohol use: No    Alcohol/week: 0.0 standard drinks    Frequency: Never  . Drug use: No  . Sexual activity: Yes    Birth control/protection: Pill  Lifestyle  . Physical activity:    Days per week: Not on file    Minutes per session: Not on file  . Stress: Very much  Relationships  . Social connections:    Talks on phone: More than three times a  week    Gets together: More than three times a week    Attends religious service: Never    Active member of club or organization: No    Attends meetings of clubs or organizations: Never    Relationship status: Never married  Other Topics Concern  . Not on file  Social History Narrative   Patient went to the ED d/t pain in her side.  Every time she eats, she has increased pain.  Ob/gyn reported after CT that she had an ovarian cyst.  She will be going ob/gyn today for an injection to help with this.     Allergies:  Allergies  Allergen Reactions  . Other Other (See Comments)    Patient states that she is allergic to any medication that is in a patch form as it will "go into her muscle."    Metabolic Disorder Labs: No results found for: HGBA1C, MPG No results found for: PROLACTIN Lab Results  Component Value Date   CHOL 168 06/15/2016   TRIG 285 (H) 06/15/2016   HDL 44 06/15/2016   CHOLHDL 3.8 06/15/2016   VLDL 57 (H) 06/15/2016   LDLCALC 67 06/15/2016   No results found for: TSH  Therapeutic Level Labs: No results found for: LITHIUM No results found for: VALPROATE No components found for:  CBMZ  Current Medications: Current Outpatient Medications  Medication Sig Dispense Refill  . aspirin EC 81 MG tablet Take 81 mg by mouth daily.    . celecoxib (CELEBREX) 200 MG capsule Take 1 capsule (200 mg total) by mouth daily after lunch for 21 days. 21 capsule 0  . cyclobenzaprine (FLEXERIL) 10 MG tablet Take 15 mg by mouth 2 (two) times daily as needed for muscle spasms.  0  . fenofibrate 160 MG tablet Take 160 mg by mouth daily.  0  . FLUoxetine (PROZAC) 40 MG capsule TAKE 1 CAPSULE(40 MG) BY MOUTH DAILY 90 capsule 1  . JUNEL FE 24 1-20 MG-MCG(24) tablet Take 1 tablet by mouth daily.  0  . metFORMIN (GLUCOPHAGE) 1000 MG tablet   0  . niacin (NIASPAN) 500 MG CR tablet Take by mouth.    . norgestrel-ethinyl estradiol (OGESTREL) 0.5-50 MG-MCG tablet Take by mouth.    .  nortriptyline (PAMELOR) 50 MG capsule Take 50 mg by mouth at bedtime.  0  . pregabalin (LYRICA) 100 MG capsule 100 mg in the qAM, 200 mg qPM 90 capsule 4  . propranolol (INDERAL) 10 MG tablet TAKE 1 TABLET(10 MG) BY MOUTH THREE TIMES DAILY AS NEEDED FOR SEVERE ANXIETY OR SYMPTOMS 270 tablet 1  . triamcinolone cream (KENALOG) 0.5 % Apply topically.    Marland Kitchen zolpidem (AMBIEN) 5 MG tablet Take 1 tablet (5 mg total) by mouth at bedtime as needed for sleep. 90 tablet  0   No current facility-administered medications for this visit.      Musculoskeletal: Strength & Muscle Tone: within normal limits Gait & Station: normal Patient leans: N/A  Psychiatric Specialty Exam: Review of Systems  Psychiatric/Behavioral: Positive for depression. The patient is nervous/anxious.   All other systems reviewed and are negative.   Blood pressure 133/90, pulse (!) 116, temperature 97.7 F (36.5 C), temperature source Oral, weight 223 lb (101.2 kg).Body mass index is 34.93 kg/m.  General Appearance: Casual  Eye Contact:  Fair  Speech:  Clear and Coherent  Volume:  Normal  Mood:  Anxious and Dysphoric  Affect:  Congruent  Thought Process:  Goal Directed and Descriptions of Associations: Intact  Orientation:  Full (Time, Place, and Person)  Thought Content: Logical   Suicidal Thoughts:  No  Homicidal Thoughts:  No  Memory:  Immediate;   Fair Recent;   Fair Remote;   Fair  Judgement:  Fair  Insight:  Fair  Psychomotor Activity:  Normal  Concentration:  Concentration: Fair and Attention Span: Fair  Recall:  [employer] of Knowledge: Fair  Language: Fair  Akathisia:  No  Handed:  Right  AIMS (if indicated): na  Assets:  Communication Skills Desire for Improvement Social Support  ADL's:  Intact  Cognition: WNL  Sleep:  Fair   Screenings: PHQ2-9     Office Visit from 09/18/2017 in [facility] Clinical Support from 08/06/2017 in [facility] Clinical Support from 04/11/2017 in [facility] Clinical Support from 12/25/2016 in [facility] from 10/30/2016 in [facility]  PHQ-2 Total Score  3  0  0  0  0  PHQ-9 Total Score  11  -  -  -  -       Assessment and Plan: Meredith Williams is a 37 year old Caucasian female who has a history of depression, anxiety, chronic pain, myotonic dystrophy, recent diagnosis of diabetes, severe migraine headaches, presented to the clinic today for a follow-up visit.  Patient continues to struggle with some anxiety symptoms.  Patient has psychosocial stressors of recent death of her sister.  Patient will benefit from medication changes as well as more frequent psychotherapy visits.  Plan MDD Continue Prozac 40 mg p.o. daily Continue nortriptyline as prescribed per neurology.  Anxiety symptoms Discontinue hydroxyzine for lack of efficacy Start propranolol 10 mg p.o. 3 times daily as needed for severe anxiety symptoms She will also make use of more frequent psychotherapy sessions for her anxiety attacks.  For insomnia Continue Ambien 5 mg p.o. nightly Continue CPAP  For bereavement Grief therapy with Ms. Elmyra Ricks.  Follow-up in clinic in 4 weeks or sooner if needed.  More than 50 % of the time was spent for psychoeducation and supportive psychotherapy and care coordination.  This note was generated in part or whole with voice recognition software. Voice recognition is usually quite accurate but there are transcription errors that can and very often do occur. I apologize for any typographical errors that were not detected and corrected.         Ursula Alert, MD 09/26/2017, 5:31 PM

## 2017-10-03 NOTE — Progress Notes (Signed)
   THERAPIST PROGRESS NOTE  Session Time: 39mn   Participation Level: Active  Behavioral Response: CasualAlertDepressed  Type of Therapy: Individual Therapy  Treatment Goals addressed: Coping  Interventions: CBT and Motivational Interviewing  Summary: Meredith DARIOis a 37y.o. female who presents with continued symptoms of her diagnosis. Therapist met with Patient in an outpatient setting to assess current mood and assist with making progress towards goals through the use of therapeutic intervention. Therapist did a brief mood check, assessing anger, fear, disgust, excitement, happiness, and sadness.  Patient reports a "melancholy" mood.  Patient reports that her sister recently passed away.  Educated patient on the 5 stages of Grief.  Answered questions for Patient and normalized her thoughts/feelings.   Suicidal/Homicidal: Yes  Plan: Return again in 2 weeks.  Diagnosis: Axis I: Depression    Axis II: No diagnosis    NLubertha South LCSW 09/26/2017

## 2017-10-07 ENCOUNTER — Ambulatory Visit: Payer: Medicare HMO | Admitting: Licensed Clinical Social Worker

## 2017-10-20 DIAGNOSIS — G4733 Obstructive sleep apnea (adult) (pediatric): Secondary | ICD-10-CM | POA: Diagnosis not present

## 2017-10-24 ENCOUNTER — Ambulatory Visit: Payer: Medicare HMO | Admitting: Psychiatry

## 2017-10-28 ENCOUNTER — Ambulatory Visit (INDEPENDENT_AMBULATORY_CARE_PROVIDER_SITE_OTHER): Payer: Medicare HMO | Admitting: Licensed Clinical Social Worker

## 2017-10-28 DIAGNOSIS — F331 Major depressive disorder, recurrent, moderate: Secondary | ICD-10-CM

## 2017-10-31 DIAGNOSIS — Z794 Long term (current) use of insulin: Secondary | ICD-10-CM | POA: Diagnosis not present

## 2017-10-31 DIAGNOSIS — L600 Ingrowing nail: Secondary | ICD-10-CM | POA: Diagnosis not present

## 2017-10-31 DIAGNOSIS — R234 Changes in skin texture: Secondary | ICD-10-CM | POA: Diagnosis not present

## 2017-10-31 DIAGNOSIS — E781 Pure hyperglyceridemia: Secondary | ICD-10-CM | POA: Diagnosis not present

## 2017-10-31 DIAGNOSIS — E119 Type 2 diabetes mellitus without complications: Secondary | ICD-10-CM | POA: Diagnosis not present

## 2017-11-05 DIAGNOSIS — E119 Type 2 diabetes mellitus without complications: Secondary | ICD-10-CM | POA: Diagnosis not present

## 2017-11-05 DIAGNOSIS — Z794 Long term (current) use of insulin: Secondary | ICD-10-CM | POA: Diagnosis not present

## 2017-11-05 DIAGNOSIS — M545 Low back pain: Secondary | ICD-10-CM | POA: Diagnosis not present

## 2017-11-07 ENCOUNTER — Ambulatory Visit (INDEPENDENT_AMBULATORY_CARE_PROVIDER_SITE_OTHER): Payer: Medicare HMO | Admitting: Psychiatry

## 2017-11-07 ENCOUNTER — Encounter: Payer: Self-pay | Admitting: Psychiatry

## 2017-11-07 VITALS — BP 114/79 | HR 101 | Ht 67.0 in | Wt 222.0 lb

## 2017-11-07 DIAGNOSIS — F4010 Social phobia, unspecified: Secondary | ICD-10-CM

## 2017-11-07 DIAGNOSIS — F331 Major depressive disorder, recurrent, moderate: Secondary | ICD-10-CM

## 2017-11-07 DIAGNOSIS — F411 Generalized anxiety disorder: Secondary | ICD-10-CM

## 2017-11-07 DIAGNOSIS — F5105 Insomnia due to other mental disorder: Secondary | ICD-10-CM | POA: Diagnosis not present

## 2017-11-07 MED ORDER — FLUOXETINE HCL 20 MG PO CAPS: 60.0000 mg | ORAL_CAPSULE | Freq: Every day | ORAL | 0 refills | Status: DC

## 2017-11-07 MED ORDER — QUETIAPINE FUMARATE 25 MG PO TABS: 12.5000 mg | ORAL_TABLET | Freq: Every day | ORAL | 0 refills | Status: DC | PRN

## 2017-11-07 NOTE — Progress Notes (Signed)
[facility] MD OP Progress Note  11/07/2017 12:44 PM Meredith Williams  MRN:  [id number]  Chief Complaint: ' I am here for follow up.' Chief Complaint    Follow-up     HPI: Markeshia is a 37 year old Caucasian female, single, lives in [place], on [place], presented to the clinic today for a follow-up visit.  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  Patient today reports she continues to have panic attacks.  She reports she tried taking the propranolol which does not help much.  She reports she would like to try another medication.  Discussed increasing her Prozac and she agrees with plan.  Also discussed adding Seroquel as needed for her anxiety attacks.  Patient reports she had a recent fall.  She reports she is not sure what really happened.  She went to her primary medical doctor who worked her up and said everything was okay.  She reports she was given a new pain medication called Celebrex which she is taking.  She reports sleep is fair.  She reports her mother continues to be a stressor for her.  She continues to be in psychotherapy with Ms. Kerby Nora which is going well.  Patient denies any suicidality.  Patient denies any perceptual disturbances.   Visit Diagnosis:    ICD-10-CM   1. MDD (major depressive disorder), recurrent episode, moderate (HCC) F33.1 FLUoxetine (PROZAC) 20 MG capsule    QUEtiapine (SEROQUEL) 25 MG tablet  2. GAD (generalized anxiety disorder) F41.1 QUEtiapine (SEROQUEL) 25 MG tablet   improving  3. Social anxiety disorder F40.10 QUEtiapine (SEROQUEL) 25 MG tablet  4. Insomnia due to mental disorder F51.05    improving    Past Psychiatric History: Reviewed past psychiatric history from my progress note on 04/16/2017.  Past trials of Cymbalta-ineffective, Viibryd, BuSpar, Effexor.  Past Medical History:  Past Medical History:  Diagnosis Date  . Anxiety   . Depression   . Diabetes mellitus without  complication (HCC)   . Hyperlipidemia   . Muscular dystrophy, myotonic (HCC)   . Pancreatitis     Past Surgical History:  Procedure Laterality Date  . ABDOMINAL HYSTERECTOMY    . HIP SURGERY Right 2016   "removal of a bone growth"    Family Psychiatric History: Reviewed family psychiatric history from my progress note on 04/16/2017.  Family History:  Family History  Problem Relation Age of Onset  . Stroke Mother   . Muscular dystrophy Mother   . Drug abuse Brother   . Depression Sister   . Anxiety disorder Sister   . Muscular dystrophy Sister   . Depression Sister     Social History: Reviewed social history from my progress note on 04/16/2017 Social History   Socioeconomic History  . Marital status: Single    Spouse name: Not on file  . Number of children: 0  . Years of education: Not on file  . Highest education level: Some college, no degree  Occupational History    Comment: disability  Social Needs  . Financial resource strain: Not hard at all  . Food insecurity:    Worry: Never true    Inability: Never true  . Transportation needs:    Medical: Yes    Non-medical: Yes  Tobacco Use  . Smoking status: Passive Smoke Exposure - Never Smoker  . Smokeless tobacco: Never Used  Substance and Sexual Activity  . Alcohol use: No    Alcohol/week: 0.0 standard drinks  Frequency: Never  . Drug use: No  . Sexual activity: Yes    Birth control/protection: Pill  Lifestyle  . Physical activity:    Days per week: Not on file    Minutes per session: Not on file  . Stress: Very much  Relationships  . Social connections:    Talks on phone: More than three times a week    Gets together: More than three times a week    Attends religious service: Never    Active member of club or organization: No    Attends meetings of clubs or organizations: Never    Relationship status: Never married  Other Topics Concern  . Not on file  Social History Narrative   Patient went to  the ED d/t pain in her side.  Every time she eats, she has increased pain.  Ob/gyn reported after CT that she had an ovarian cyst.  She will be going ob/gyn today for an injection to help with this.     Allergies:  Allergies  Allergen Reactions  . Other Other (See Comments)    Patient states that she is allergic to any medication that is in a patch form as it will "go into her muscle."    Metabolic Disorder Labs: No results found for: HGBA1C, MPG No results found for: PROLACTIN Lab Results  Component Value Date   CHOL 168 06/15/2016   TRIG 285 (H) 06/15/2016   HDL 44 06/15/2016   CHOLHDL 3.8 06/15/2016   VLDL 57 (H) 06/15/2016   LDLCALC 67 06/15/2016   No results found for: TSH  Therapeutic Level Labs: No results found for: LITHIUM No results found for: VALPROATE No components found for:  CBMZ  Current Medications: Current Outpatient Medications  Medication Sig Dispense Refill  . aspirin EC 81 MG tablet Take 81 mg by mouth daily.    . cyclobenzaprine (FLEXERIL) 10 MG tablet Take 15 mg by mouth 2 (two) times daily as needed for muscle spasms.  0  . fenofibrate 160 MG tablet Take 160 mg by mouth daily.  0  . JUNEL FE 24 1-20 MG-MCG(24) tablet Take 1 tablet by mouth daily.  0  . metFORMIN (GLUCOPHAGE) 1000 MG tablet   0  . niacin (NIASPAN) 500 MG CR tablet Take by mouth.    . norgestrel-ethinyl estradiol (OGESTREL) 0.5-50 MG-MCG tablet Take by mouth.    . nortriptyline (PAMELOR) 50 MG capsule Take 50 mg by mouth at bedtime.  0  . pregabalin (LYRICA) 100 MG capsule 100 mg in the qAM, 200 mg qPM 90 capsule 4  . zolpidem (AMBIEN) 5 MG tablet Take 1 tablet (5 mg total) by mouth at bedtime as needed for sleep. 90 tablet 0  . FLUoxetine (PROZAC) 20 MG capsule Take 3 capsules (60 mg total) by mouth daily. 270 capsule 0  . propranolol (INDERAL) 10 MG tablet TAKE 1 TABLET(10 MG) BY MOUTH THREE TIMES DAILY AS NEEDED FOR SEVERE ANXIETY OR SYMPTOMS (Patient not taking: Reported on  11/07/2017) 270 tablet 1  . QUEtiapine (SEROQUEL) 25 MG tablet Take 0.5-1 tablets (12.5-25 mg total) by mouth daily as needed. For severe anxiety/agitation 90 tablet 0  . triamcinolone cream (KENALOG) 0.5 % Apply topically.     No current facility-administered medications for this visit.      Musculoskeletal: Strength & Muscle Tone: within normal limits Gait & Station: normal Patient leans: N/A  Psychiatric Specialty Exam: Review of Systems  Psychiatric/Behavioral: The patient is nervous/anxious.   All other systems  reviewed and are negative.   Blood pressure 114/79, pulse (!) 101, height 5\' 7"  (1.702 m), weight 222 lb (100.7 kg).Body mass index is 34.77 kg/m.  General Appearance: Fairly Groomed  Eye Contact:  Fair  Speech:  Normal Rate  Volume:  Normal  Mood:  Anxious  Affect:  Congruent  Thought Process:  Goal Directed and Descriptions of Associations: Intact  Orientation:  Full (Time, Place, and Person)  Thought Content: WDL   Suicidal Thoughts:  No  Homicidal Thoughts:  No  Memory:  Immediate;   Fair Recent;   Fair Remote;   Fair  Judgement:  Fair  Insight:  Fair  Psychomotor Activity:  Normal  Concentration:  Concentration: Fair and Attention Span: Fair  Recall:  [employer] of Knowledge: Fair  Language: Fair  Akathisia:  No  Handed:  Right  AIMS (if indicated): denies rigidity,stiffness  Assets:  Communication Skills Desire for Improvement Social Support  ADL's:  Intact  Cognition: WNL  Sleep:  Fair   Screenings: PHQ2-9     [facility] Visit from [facility]18[facility]2017 in [facility] Clinical Support from 08/06/2017 in [facility] Clinical Support from 04[facility]11/2017 in [facility] Clinical Support from 12/25/2016 [facility] Clinical Support from 10/30/2016 in [facility]  PHQ-2 Total Score  3  0  0  0  0  PHQ-9 Total Score  11  -  -  -  -       Assessment and Plan: Stormi is a 37 year old Caucasian female who has a history of depression, anxiety, chronic pain, myotonic dystrophy, recent diagnosis of diabetes, migraine headaches, presented to the clinic today for a follow-up visit.  Patient continues to struggle with anxiety symptoms.  Patient has several psychosocial stressors like recent death of her sister, her own health issues and her mother's health problems.  Patient will continue to benefit from medication management as well as psychotherapy visits.  Plan MDD Increase Prozac to 60 mg p.o. daily Continue nortriptyline as prescribed per neurology. Start Seroquel 12.5-25 mg p.o. daily as needed for severe anxiety symptoms/agitation.  For anxiety Continue Prozac as prescribed Seroquel as needed.  For insomnia Ambien 5 mg p.o. nightly Continue CPAP.  Bereavement Continue psychotherapy sessions with Ms. Peacock.  Follow-up in clinic in 1 month or sooner if needed.  More than 50 % of the time was spent for psychoeducation and supportive psychotherapy and care coordination.  This note was generated in part or whole with voice recognition software. Voice recognition is usually quite accurate but there are transcription errors that can and very often do occur. I apologize for any typographical errors that were not detected and corrected.           Jomarie Longs, MD 11/07/2017, 12:44 PM

## 2017-11-12 DIAGNOSIS — E119 Type 2 diabetes mellitus without complications: Secondary | ICD-10-CM | POA: Diagnosis not present

## 2017-11-12 DIAGNOSIS — E781 Pure hyperglyceridemia: Secondary | ICD-10-CM | POA: Diagnosis not present

## 2017-11-12 DIAGNOSIS — Z794 Long term (current) use of insulin: Secondary | ICD-10-CM | POA: Diagnosis not present

## 2017-11-19 DIAGNOSIS — G4733 Obstructive sleep apnea (adult) (pediatric): Secondary | ICD-10-CM | POA: Diagnosis not present

## 2017-11-24 NOTE — Progress Notes (Signed)
   THERAPIST PROGRESS NOTE  Session Time: 83mn  Participation Level: Active  Behavioral Response: CasualAlertAnxious  Type of Therapy: Individual Therapy  Treatment Goals addressed: Coping  Interventions: CBT  Summary: Meredith FIEBELKORNis a 37y.o. female who presents with continued symptoms of her diagnosis.  Therapist met with Patient in an outpatient setting to assess current mood and assist with making progress towards goals through the use of therapeutic intervention. Therapist did a brief mood check, assessing anger, fear, disgust, excitement, happiness, and sadness.  Patient reports an "okay" mood.  Actively listened as patient described her current stressors.  Patient reports being drained from assisting her mother and dealing with the death of her sister.  Encouraged patient to use coping skills as she reports that she does not.  Educated patient on the stages of grief and loss.  Explained to Patient each stage and discussed what stage she feels that she is in.  Taught a relaxation technique.    Suicidal/Homicidal: No  Plan: Return again in 2 weeks.  Diagnosis: Axis I: Depression    Axis II: No diagnosis    NLubertha South LCSW 10/28/2017

## 2017-11-28 ENCOUNTER — Ambulatory Visit (INDEPENDENT_AMBULATORY_CARE_PROVIDER_SITE_OTHER): Payer: Medicare HMO | Admitting: Licensed Clinical Social Worker

## 2017-11-28 DIAGNOSIS — F331 Major depressive disorder, recurrent, moderate: Secondary | ICD-10-CM

## 2017-11-29 NOTE — Progress Notes (Signed)
   THERAPIST PROGRESS NOTE  Session Time: 60 min  Participation Level: Active  Behavioral Response: CasualAlertEuthymic  Type of Therapy: Individual Therapy  Treatment Goals addressed: Coping  Interventions: CBT  Summary: Meredith Williams is a 37 y.o. female who presents with a reduction in symptoms. Patient reports "favorable" mood.  Patient listed stressors and listed how she is coping with each.  Patient expressed how she has experienced positive peer social interactions with friends.  Patient denies many depressive symptoms.  LCSW provided Patient with ongoing emotional support and encouragement.  Normalized her feelings.  Commended Patient on her progress and reinforced the importance of client staying focused on her own strengths and resources and resiliency. Processed various strategies for dealing with stressors.     Suicidal/Homicidal: No  Plan: Return again in 2 weeks.  Diagnosis: Axis I: Depression    Axis II: No diagnosis    Marinda Elk, LCSW 11/28/2017

## 2017-12-05 ENCOUNTER — Ambulatory Visit: Payer: Medicare HMO | Admitting: Student in an Organized Health Care Education/Training Program

## 2017-12-05 ENCOUNTER — Other Ambulatory Visit: Payer: Self-pay

## 2017-12-05 ENCOUNTER — Encounter: Payer: Self-pay | Admitting: Psychiatry

## 2017-12-05 ENCOUNTER — Ambulatory Visit (INDEPENDENT_AMBULATORY_CARE_PROVIDER_SITE_OTHER): Payer: Medicare HMO | Admitting: Psychiatry

## 2017-12-05 VITALS — BP 111/73 | HR 106 | Temp 98.4°F | Wt 221.2 lb

## 2017-12-05 DIAGNOSIS — F4010 Social phobia, unspecified: Secondary | ICD-10-CM

## 2017-12-05 DIAGNOSIS — F411 Generalized anxiety disorder: Secondary | ICD-10-CM | POA: Diagnosis not present

## 2017-12-05 DIAGNOSIS — F5105 Insomnia due to other mental disorder: Secondary | ICD-10-CM | POA: Diagnosis not present

## 2017-12-05 DIAGNOSIS — F331 Major depressive disorder, recurrent, moderate: Secondary | ICD-10-CM | POA: Diagnosis not present

## 2017-12-05 MED ORDER — ZOLPIDEM TARTRATE 10 MG PO TABS: 10.0000 mg | ORAL_TABLET | Freq: Every evening | ORAL | 1 refills | Status: DC | PRN

## 2017-12-05 NOTE — Progress Notes (Signed)
BH MD  OP Progress Note  12/05/2017 5:54 PM TOI STELLY  MRN:  [id number]  Chief Complaint: ' I am here for follow up.' Chief Complaint    Follow-up; Medication Refill     HPI: Meredith Williams is a 37 year old Caucasian female, single, lives in [place], on [place], presented to the clinic today for a follow-up visit.  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  Patient today reports she did not tolerate the Seroquel well.  She reports her Seroquel kept her awake at night.  Patient reports she would like her sleep medication readjusted.  Patient is already on Ambien 5 mg and Pamelor per neurology for her pain.  Discussed with patient that the Ambien dosage can be increased and to let writer know if it is not effective and at that point it can be changed to another medication if necessary.  Patient continues to have stressors at home.  She reports she continues to feel overwhelmed at times taking care of her mother.  She reports she is trying to get out as much as possible.  She also has been in psychotherapy sessions with Ms. Kerby Nora which is going well.  Patient denies any suicidality.  Patient denies any perceptual disturbances.  She denies any other concerns today. Visit Diagnosis:    ICD-10-CM   1. MDD (major depressive disorder), recurrent episode, moderate (HCC) F33.1   2. GAD (generalized anxiety disorder) F41.1   3. Social anxiety disorder F40.10   4. Insomnia due to mental disorder F51.05     Past Psychiatric History: I have reviewed past psychiatric history from my progress note on 04/16/2017.  Past trials of Cymbalta-ineffective, Viibryd, BuSpar, Effexor.  Past Medical History:  Past Medical History:  Diagnosis Date  . Anxiety   . Depression   . Diabetes mellitus without complication (HCC)   . Hyperlipidemia   . Muscular dystrophy, myotonic (HCC)   . Pancreatitis     Past Surgical History:  Procedure  Laterality Date  . ABDOMINAL HYSTERECTOMY    . HIP SURGERY Right 2016   "removal of a bone growth"    Family Psychiatric History: Have reviewed family psychiatric history from my progress note on 04/16/2017  Family History:  Family History  Problem Relation Age of Onset  . Stroke Mother   . Muscular dystrophy Mother   . Drug abuse Brother   . Depression Sister   . Anxiety disorder Sister   . Muscular dystrophy Sister   . Depression Sister     Social History: I have reviewed social history from my progress note on 04/16/2017 Social History   Socioeconomic History  . Marital status: Single    Spouse name: Not on file  . Number of children: 0  . Years of education: Not on file  . Highest education level: Some college, no degree  Occupational History    Comment: disability  Social Needs  . Financial resource strain: Not hard at all  . Food insecurity:    Worry: Never true    Inability: Never true  . Transportation needs:    Medical: Yes    Non-medical: Yes  Tobacco Use  . Smoking status: Passive Smoke Exposure - Never Smoker  . Smokeless tobacco: Never Used  Substance and Sexual Activity  . Alcohol use: No    Alcohol/week: 0.0 standard drinks    Frequency: Never  . Drug use: No  . Sexual activity: Yes    Birth control/protection:  Pill  Lifestyle  . Physical activity:    Days per week: Not on file    Minutes per session: Not on file  . Stress: Very much  Relationships  . Social connections:    Talks on phone: More than three times a week    Gets together: More than three times a week    Attends religious service: Never    Active member of club or organization: No    Attends meetings of clubs or organizations: Never    Relationship status: Never married  Other Topics Concern  . Not on file  Social History Narrative   Patient went to the ED d/t pain in her side.  Every time she eats, she has increased pain.  Ob/gyn reported after CT that she had an ovarian  cyst.  She will be going ob/gyn today for an injection to help with this.     Allergies:  Allergies  Allergen Reactions  . Other Other (See Comments)    Patient states that she is allergic to any medication that is in a patch form as it will "go into her muscle."    Metabolic Disorder Labs: No results found for: HGBA1C, MPG No results found for: PROLACTIN Lab Results  Component Value Date   CHOL 168 06/15/2016   TRIG 285 (H) 06/15/2016   HDL 44 06/15/2016   CHOLHDL 3.8 06/15/2016   VLDL 57 (H) 06/15/2016   LDLCALC 67 06/15/2016   No results found for: TSH  Therapeutic Level Labs: No results found for: LITHIUM No results found for: VALPROATE No components found for:  CBMZ  Current Medications: Current Outpatient Medications  Medication Sig Dispense Refill  . aspirin EC 81 MG tablet Take 81 mg by mouth daily.    . cyclobenzaprine (FLEXERIL) 10 MG tablet Take 15 mg by mouth 2 (two) times daily as needed for muscle spasms.  0  . fenofibrate 160 MG tablet Take 160 mg by mouth daily.  0  . FLUoxetine (PROZAC) 20 MG capsule Take 3 capsules (60 mg total) by mouth daily. 270 capsule 0  . JUNEL FE 24 1-20 MG-MCG(24) tablet Take 1 tablet by mouth daily.  0  . metFORMIN (GLUCOPHAGE) 1000 MG tablet   0  . niacin (NIASPAN) 500 MG CR tablet Take by mouth.    . norgestrel-ethinyl estradiol (OGESTREL) 0.5-50 MG-MCG tablet Take by mouth.    . nortriptyline (PAMELOR) 50 MG capsule Take 50 mg by mouth at bedtime.  0  . pregabalin (LYRICA) 100 MG capsule 100 mg in the qAM, 200 mg qPM 90 capsule 4  . propranolol (INDERAL) 10 MG tablet TAKE 1 TABLET(10 MG) BY MOUTH THREE TIMES DAILY AS NEEDED FOR SEVERE ANXIETY OR SYMPTOMS 270 tablet 1  . triamcinolone cream (KENALOG) 0.5 % Apply topically.    . celecoxib (CELEBREX) 200 MG capsule     . TRESIBA FLEXTOUCH 100 UNIT/ML SOPN FlexTouch Pen INJECT 50 UNITS SUBCUTANEOUSLY ONCE DAILY  11  . zolpidem (AMBIEN) 10 MG tablet Take 1 tablet (10 mg total) by  mouth at bedtime as needed for sleep. 30 tablet 1   No current facility-administered medications for this visit.      Musculoskeletal: Strength & Muscle Tone: within normal limits Gait & Station: normal Patient leans: N/A  Psychiatric Specialty Exam: Review of Systems  Psychiatric/Behavioral: The patient is nervous/anxious and has insomnia.   All other systems reviewed and are negative.   Blood pressure 111/73, pulse (!) 106, temperature 98.4 F (36.9 C),  temperature source Oral, weight 221 lb 3.2 oz (100.3 kg).Body mass index is 34.64 kg/m.  General Appearance: Casual  Eye Contact:  Fair  Speech:  Clear and Coherent  Volume:  Normal  Mood:  Anxious  Affect:  Congruent  Thought Process:  Goal Directed and Descriptions of Associations: Intact  Orientation:  Full (Time, Place, and Person)  Thought Content: Logical   Suicidal Thoughts:  No  Homicidal Thoughts:  No  Memory:  Immediate;   Fair Recent;   Fair Remote;   Fair  Judgement:  Fair  Insight:  Fair  Psychomotor Activity:  Normal  Concentration:  Concentration: Fair and Attention Span: Fair  Recall:  [employer]  Fund of Knowledge: Fair  Language: Fair  Akathisia:  No  Handed:  Right  AIMS (if indicated): denies tremors, rigidity,stiffness  Assets:  Communication Skills Desire for Improvement Social Support  ADL's:  Intact  Cognition: WNL  Sleep:  Poor   Screenings: PHQ2-9     [facility] Clinical [facility]06[facility]2017 [facility]  PHQ-2 Total Score  3  0  0  0  0  PHQ-9 Total Score  11  -  -  -  -       Assessment and Plan:  Meredith Williams is a 37 year old Caucasian female who has a history of depression, anxiety, chronic pain, myotonic dystrophy, recent diagnosis of diabetes, migraine headaches, presented to the clinic today for a follow-up visit.  Patient reports she continues to struggle with sleep.  She continues to have psychosocial stressors at home.  Discussed medication readjustment with patient.  She will continue psychotherapy sessions.  Plan MDD Prozac 60 mg p.o. daily Continue Pamelor as prescribed per neurology Discontinue Seroquel for side effects.  For anxiety Continue Prozac as prescribed  For insomnia Increase Ambien to 10 mg p.o. nightly Continue CPAP  For bereavement She will continue psychotherapy sessions with Ms. Peacock.  Follow-up in clinic in 4 weeks or sooner if needed.  More than 50 % of the time was spent for psychoeducation and supportive psychotherapy and care coordination.  This note was generated in part or whole with voice recognition software. Voice recognition is usually quite accurate but there are transcription errors that can and very often do occur. I apologize for any typographical errors that were not detected and corrected.       Jomarie LongsSaramma Piero Mustard, MD 12/05/2017, 5:54 PM

## 2017-12-08 ENCOUNTER — Encounter: Payer: Self-pay | Admitting: Radiology

## 2017-12-08 ENCOUNTER — Emergency Department: Payer: Medicare HMO

## 2017-12-08 ENCOUNTER — Other Ambulatory Visit: Payer: Self-pay

## 2017-12-08 ENCOUNTER — Emergency Department
Admission: EM | Admit: 2017-12-08 | Discharge: 2017-12-08 | Disposition: A | Discharge status: Home / Self Care | Payer: Medicare HMO | Attending: Emergency Medicine | Admitting: Emergency Medicine

## 2017-12-08 DIAGNOSIS — Y998 Other external cause status: Secondary | ICD-10-CM | POA: Diagnosis not present

## 2017-12-08 DIAGNOSIS — Z79899 Other long term (current) drug therapy: Secondary | ICD-10-CM | POA: Insufficient documentation

## 2017-12-08 DIAGNOSIS — Z7984 Long term (current) use of oral hypoglycemic drugs: Secondary | ICD-10-CM | POA: Insufficient documentation

## 2017-12-08 DIAGNOSIS — Y9389 Activity, other specified: Secondary | ICD-10-CM | POA: Diagnosis not present

## 2017-12-08 DIAGNOSIS — S0990XA Unspecified injury of head, initial encounter: Secondary | ICD-10-CM | POA: Diagnosis not present

## 2017-12-08 DIAGNOSIS — S3991XA Unspecified injury of abdomen, initial encounter: Secondary | ICD-10-CM | POA: Diagnosis not present

## 2017-12-08 DIAGNOSIS — R1031 Right lower quadrant pain: Secondary | ICD-10-CM | POA: Diagnosis not present

## 2017-12-08 DIAGNOSIS — S4991XA Unspecified injury of right shoulder and upper arm, initial encounter: Secondary | ICD-10-CM | POA: Diagnosis not present

## 2017-12-08 DIAGNOSIS — W2210XA Striking against or struck by unspecified automobile airbag, initial encounter: Secondary | ICD-10-CM | POA: Insufficient documentation

## 2017-12-08 DIAGNOSIS — Z7982 Long term (current) use of aspirin: Secondary | ICD-10-CM | POA: Insufficient documentation

## 2017-12-08 DIAGNOSIS — S8991XA Unspecified injury of right lower leg, initial encounter: Secondary | ICD-10-CM | POA: Diagnosis not present

## 2017-12-08 DIAGNOSIS — M25561 Pain in right knee: Secondary | ICD-10-CM | POA: Insufficient documentation

## 2017-12-08 DIAGNOSIS — Y92410 Unspecified street and highway as the place of occurrence of the external cause: Secondary | ICD-10-CM | POA: Insufficient documentation

## 2017-12-08 DIAGNOSIS — M25511 Pain in right shoulder: Secondary | ICD-10-CM | POA: Diagnosis not present

## 2017-12-08 DIAGNOSIS — Z7722 Contact with and (suspected) exposure to environmental tobacco smoke (acute) (chronic): Secondary | ICD-10-CM | POA: Insufficient documentation

## 2017-12-08 DIAGNOSIS — R0789 Other chest pain: Secondary | ICD-10-CM | POA: Diagnosis not present

## 2017-12-08 DIAGNOSIS — E119 Type 2 diabetes mellitus without complications: Secondary | ICD-10-CM | POA: Diagnosis not present

## 2017-12-08 DIAGNOSIS — S299XXA Unspecified injury of thorax, initial encounter: Secondary | ICD-10-CM | POA: Diagnosis not present

## 2017-12-08 DIAGNOSIS — R109 Unspecified abdominal pain: Secondary | ICD-10-CM | POA: Diagnosis not present

## 2017-12-08 LAB — CBC
HCT: 44.3 % (ref 36.0–46.0)
Hemoglobin: 14.4 g/dL (ref 12.0–15.0)
MCH: 29.8 pg (ref 26.0–34.0)
MCHC: 32.5 g/dL (ref 30.0–36.0)
MCV: 91.7 fL (ref 80.0–100.0)
NRBC: 0 % (ref 0.0–0.2)
PLATELETS: 258 10*3/uL (ref 150–400)
RBC: 4.83 MIL/uL (ref 3.87–5.11)
RDW: 13.3 % (ref 11.5–15.5)
WBC: 5.8 10*3/uL (ref 4.0–10.5)

## 2017-12-08 LAB — COMPREHENSIVE METABOLIC PANEL
ALK PHOS: 30 U/L — AB (ref 38–126)
ALT: 27 U/L (ref 0–44)
AST: 30 U/L (ref 15–41)
Albumin: 3.8 g/dL (ref 3.5–5.0)
Anion gap: 8 (ref 5–15)
BILIRUBIN TOTAL: 0.6 mg/dL (ref 0.3–1.2)
BUN: 21 mg/dL — ABNORMAL HIGH (ref 6–20)
CALCIUM: 9.8 mg/dL (ref 8.9–10.3)
CO2: 30 mmol/L (ref 22–32)
CREATININE: 1.07 mg/dL — AB (ref 0.44–1.00)
Chloride: 105 mmol/L (ref 98–111)
GFR calc Af Amer: >60 mL/min (ref >60)
Glucose, Bld: 128 mg/dL — ABNORMAL HIGH (ref 70–99)
Potassium: 4.7 mmol/L (ref 3.5–5.1)
Sodium: 143 mmol/L (ref 135–145)
TOTAL PROTEIN: 6.4 g/dL — AB (ref 6.5–8.1)

## 2017-12-08 LAB — LIPASE, BLOOD: LIPASE: 28 U/L (ref 11–51)

## 2017-12-08 MED ORDER — IOHEXOL 300 MG/ML  SOLN
100.0000 mL | Freq: Once | INTRAMUSCULAR | Status: AC | PRN
  Administered 2017-12-08: 100 mL via INTRAVENOUS

## 2017-12-08 MED ORDER — KETOROLAC TROMETHAMINE 30 MG/ML IJ SOLN
30.0000 mg | Freq: Once | INTRAMUSCULAR | Status: AC
  Administered 2017-12-08: 30 mg via INTRAVENOUS
  Filled 2017-12-08: qty 1

## 2017-12-08 NOTE — Discharge Instructions (Addendum)
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
## 2017-12-08 NOTE — ED Notes (Signed)
Dr. Fanny BienQuale aware of pts sats at 90-92% on room air.

## 2017-12-08 NOTE — ED Notes (Signed)
To X-ray
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
## 2017-12-11 DIAGNOSIS — Z794 Long term (current) use of insulin: Secondary | ICD-10-CM | POA: Diagnosis not present

## 2017-12-11 DIAGNOSIS — E119 Type 2 diabetes mellitus without complications: Secondary | ICD-10-CM | POA: Diagnosis not present

## 2017-12-11 DIAGNOSIS — E781 Pure hyperglyceridemia: Secondary | ICD-10-CM | POA: Diagnosis not present

## 2017-12-16 NOTE — Progress Notes (Signed)
This encounter was created in error - please disregard.

## 2017-12-16 NOTE — Progress Notes (Signed)
Not my patient

## 2017-12-18 DIAGNOSIS — Z0000 Encounter for general adult medical examination without abnormal findings: Secondary | ICD-10-CM | POA: Diagnosis not present

## 2017-12-20 DIAGNOSIS — G4733 Obstructive sleep apnea (adult) (pediatric): Secondary | ICD-10-CM | POA: Diagnosis not present

## 2017-12-24 ENCOUNTER — Ambulatory Visit: Payer: Medicare HMO | Admitting: Licensed Clinical Social Worker

## 2018-01-02 ENCOUNTER — Encounter: Payer: Self-pay | Admitting: Psychiatry

## 2018-01-02 ENCOUNTER — Ambulatory Visit (INDEPENDENT_AMBULATORY_CARE_PROVIDER_SITE_OTHER): Payer: Medicare HMO | Admitting: Psychiatry

## 2018-01-02 ENCOUNTER — Other Ambulatory Visit: Payer: Self-pay

## 2018-01-02 VITALS — BP 107/71 | HR 102 | Temp 97.5°F | Wt 221.3 lb

## 2018-01-02 DIAGNOSIS — F4010 Social phobia, unspecified: Secondary | ICD-10-CM

## 2018-01-02 DIAGNOSIS — F411 Generalized anxiety disorder: Secondary | ICD-10-CM

## 2018-01-02 DIAGNOSIS — F5105 Insomnia due to other mental disorder: Secondary | ICD-10-CM

## 2018-01-02 DIAGNOSIS — F331 Major depressive disorder, recurrent, moderate: Secondary | ICD-10-CM | POA: Diagnosis not present

## 2018-01-02 MED ORDER — FLUOXETINE HCL 40 MG PO CAPS: 80.0000 mg | ORAL_CAPSULE | Freq: Every day | ORAL | 1 refills | Status: DC

## 2018-01-02 NOTE — Progress Notes (Signed)
BH MD OP Progress Note  01/02/2018 11:16 AM Meredith Williams  MRN:  [id number]  Chief Complaint: ' I am here for follow up." Chief Complaint    Follow-up; Medication Refill     HPI: Meredith Williams is a 37 year old Caucasian female, single, lives in [place], on [place], presented to the clinic today for a follow-up visit.  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  Patient today reports she continues to feel overwhelmed.  She reports she has been thinking a lot about her sister who passed away recently.  She reports anxiety symptoms as well as sadness on and off.  She reports she has been trying to distract herself by spending more time with friends.  She reports she went and spent 3 to 4 days with a friend out in the country and it helped her a lot.  Patient reports she is also worried about hair loss which is getting worse.  She is not sure if it is due to one of her medications or another underlying cause.  Discussed with patient that some of her medications like Pamelor and propranolol can cause hair loss.  Her Pamelor is being prescribed by neurology.  She will have that discussion with her neurologist.  Patient agrees to increasing her Prozac to a higher dosage today.  She denies any side effects to her medications and reports she is compliant with them.  Patient reports sleep is good.  She takes Ambien and denies any side effects.  She is also compliant with her CPAP.  Patient denies any suicidality, homicidality or perceptual disturbances.   Visit Diagnosis:    ICD-10-CM   1. MDD (major depressive disorder), recurrent episode, moderate (HCC) F33.1   2. GAD (generalized anxiety disorder) F41.1   3. Social anxiety disorder F40.10   4. Insomnia due to mental disorder F51.05     Past Psychiatric History: Reviewed past psychiatric history from my progress note on 04/16/2017.  Past trials of Cymbalta-ineffective, Viibryd,  BuSpar, Effexor  Past Medical History:  Past Medical History:  Diagnosis Date  . Anxiety   . Depression   . Diabetes mellitus without complication (HCC)   . Hyperlipidemia   . Muscular dystrophy, myotonic (HCC)   . Pancreatitis     Past Surgical History:  Procedure Laterality Date  . ABDOMINAL HYSTERECTOMY    . HIP SURGERY Right 2016   "removal of a bone growth"    Family Psychiatric History: I have reviewed family psychiatric history from my progress note on 04/16/2017  Family History:  Family History  Problem Relation Age of Onset  . Stroke Mother   . Muscular dystrophy Mother   . Drug abuse Brother   . Depression Sister   . Anxiety disorder Sister   . Muscular dystrophy Sister   . Depression Sister     Social History: I have reviewed social history from my progress note on 04/16/2017 Social History   Socioeconomic History  . Marital status: Single    Spouse name: Not on file  . Number of children: 0  . Years of education: Not on file  . Highest education level: Some college, no degree  Occupational History    Comment: disability  Social Needs  . Financial resource strain: Not hard at all  . Food insecurity:    Worry: Never true    Inability: Never true  . Transportation needs:    Medical: Yes    Non-medical: Yes  Tobacco  Use  . Smoking status: Passive Smoke Exposure - Never Smoker  . Smokeless tobacco: Never Used  Substance and Sexual Activity  . Alcohol use: No    Alcohol/week: 0.0 standard drinks    Frequency: Never  . Drug use: No  . Sexual activity: Yes    Birth control/protection: Pill  Lifestyle  . Physical activity:    Days per week: Not on file    Minutes per session: Not on file  . Stress: Very much  Relationships  . Social connections:    Talks on phone: More than three times a week    Gets together: More than three times a week    Attends religious service: Never    Active member of club or organization: No    Attends meetings of  clubs or organizations: Never    Relationship status: Never married  Other Topics Concern  . Not on file  Social History Narrative   Patient went to the ED d/t pain in her side.  Every time she eats, she has increased pain.  Ob/gyn reported after CT that she had an ovarian cyst.  She will be going ob/gyn today for an injection to help with this.     Allergies:  Allergies  Allergen Reactions  . Other Other (See Comments)    Patient states that she is allergic to any medication that is in a patch form as it will "go into her muscle."    Metabolic Disorder Labs: No results found for: HGBA1C, MPG No results found for: PROLACTIN Lab Results  Component Value Date   CHOL 168 06/15/2016   TRIG 285 (H) 06/15/2016   HDL 44 06/15/2016   CHOLHDL 3.8 06/15/2016   VLDL 57 (H) 06/15/2016   LDLCALC 67 06/15/2016   No results found for: TSH  Therapeutic Level Labs: No results found for: LITHIUM No results found for: VALPROATE No components found for:  CBMZ  Current Medications: Current Outpatient Medications  Medication Sig Dispense Refill  . aspirin EC 81 MG tablet Take 81 mg by mouth daily.    . celecoxib (CELEBREX) 200 MG capsule     . cyclobenzaprine (FLEXERIL) 10 MG tablet Take 15 mg by mouth 2 (two) times daily as needed for muscle spasms.  0  . cyclobenzaprine (FLEXERIL) 10 MG tablet TAKE 1 AND 1/2 TABLETS(15 MG) BY MOUTH TWICE DAILY AS NEEDED FOR MUSCLE SPASMS    . fenofibrate 160 MG tablet Take 160 mg by mouth daily.  0  . FLUoxetine (PROZAC) 40 MG capsule Take 2 capsules (80 mg total) by mouth daily. 180 capsule 1  . JUNEL FE 24 1-20 MG-MCG(24) tablet Take 1 tablet by mouth daily.  0  . metFORMIN (GLUCOPHAGE) 1000 MG tablet   0  . metFORMIN (GLUCOPHAGE) 1000 MG tablet TAKE 1 TABLET BY MOUTH TWICE A DAY WITH FOOD    . niacin (NIASPAN) 500 MG CR tablet Take by mouth.    . norgestrel-ethinyl estradiol (OGESTREL) 0.5-50 MG-MCG tablet Take by mouth.    . nortriptyline (PAMELOR) 50  MG capsule Take 50 mg by mouth at bedtime.  0  . pregabalin (LYRICA) 100 MG capsule 100 mg in the qAM, 200 mg qPM 90 capsule 4  . propranolol (INDERAL) 10 MG tablet TAKE 1 TABLET(10 MG) BY MOUTH THREE TIMES DAILY AS NEEDED FOR SEVERE ANXIETY OR SYMPTOMS 270 tablet 1  . traMADol (ULTRAM) 50 MG tablet     . TRESIBA FLEXTOUCH 100 UNIT/ML SOPN FlexTouch Pen INJECT 50 UNITS  SUBCUTANEOUSLY ONCE DAILY  11  . triamcinolone cream (KENALOG) 0.5 % Apply topically.    Marland Kitchen. zolpidem (AMBIEN) 10 MG tablet Take 1 tablet (10 mg total) by mouth at bedtime as needed for sleep. 30 tablet 1   No current facility-administered medications for this visit.      Musculoskeletal: Strength & Muscle Tone: within normal limits Gait & Station: normal Patient leans: N/A  Psychiatric Specialty Exam: Review of Systems  Psychiatric/Behavioral: Positive for depression. The patient is nervous/anxious.   All other systems reviewed and are negative.   Blood pressure 107/71, pulse (!) 102, temperature (!) 97.5 F (36.4 C), temperature source Oral, weight 221 lb 4.8 oz (100.4 kg).Body mass index is 34.66 kg/m.  General Appearance: Casual  Eye Contact:  Fair  Speech:  Normal Rate  Volume:  Normal  Mood:  Anxious and Dysphoric  Affect:  Appropriate  Thought Process:  Goal Directed and Descriptions of Associations: Intact  Orientation:  Full (Time, Place, and Person)  Thought Content: Logical   Suicidal Thoughts:  No  Homicidal Thoughts:  No  Memory:  Immediate;   Fair Recent;   Fair Remote;   Fair  Judgement:  Fair  Insight:  Fair  Psychomotor Activity:  Normal  Concentration:  Concentration: Fair and Attention Span: Fair  Recall:  [employer]Fair  Fund of Knowledge: Fair  Language: Fair  Akathisia:  No  Handed:  Right  AIMS (if indicated): Denies rigidity, tremors, stiffness  Assets:  Communication Skills Desire for Improvement Social Support  ADL's:  Intact  Cognition: WNL  Sleep:  Fair   Screenings: PHQ2-9      [facility] Clinical Support from [facility] Clinical [facility] from [facility]  PHQ-2 Total Score  3  0  0  0  0  PHQ-9 Total Score  11  -  -  -  -       Assessment and Plan: Maralyn SagoSarah is a 37 year old Caucasian female who has a history of depression, anxiety, chronic pain, myotonic dystrophy, recent diagnosis of diabetes, migraine headaches, presented to the clinic today for a follow-up visit.  Patient reports she continues to struggle with some depression and anxiety symptoms.  Will make the following medication changes.  She will continue psychotherapy sessions.  Plan MDD Increase Prozac to 80 mg p.o. daily Continue Pamelor as prescribed per neurology.   For anxiety Prozac as prescribed  For insomnia Ambien 10 mg p.o. nightly Continue CPAP  For bereavement She will continue psychotherapy sessions with Ms. Peacock.  Follow-up in clinic in 4 weeks or sooner if needed.  More than 50 % of the time was spent for psychoeducation and supportive psychotherapy and care coordination.  This note was generated in part or whole with voice recognition software. Voice recognition is usually quite accurate but there are transcription errors that can and very often do occur. I apologize for any typographical errors that were not detected and corrected.       Jomarie LongsSaramma Dayton Kenley, MD 01/02/2018, 11:16 AM

## 2018-01-16 DIAGNOSIS — G4733 Obstructive sleep apnea (adult) (pediatric): Secondary | ICD-10-CM | POA: Diagnosis not present

## 2018-01-19 DIAGNOSIS — G4733 Obstructive sleep apnea (adult) (pediatric): Secondary | ICD-10-CM | POA: Diagnosis not present

## 2018-01-20 DIAGNOSIS — J0110 Acute frontal sinusitis, unspecified: Secondary | ICD-10-CM | POA: Diagnosis not present

## 2018-01-23 ENCOUNTER — Ambulatory Visit (INDEPENDENT_AMBULATORY_CARE_PROVIDER_SITE_OTHER): Payer: Medicare HMO | Admitting: Licensed Clinical Social Worker

## 2018-01-23 DIAGNOSIS — F331 Major depressive disorder, recurrent, moderate: Secondary | ICD-10-CM | POA: Diagnosis not present

## 2018-01-23 DIAGNOSIS — F411 Generalized anxiety disorder: Secondary | ICD-10-CM

## 2018-01-23 NOTE — Progress Notes (Signed)
   THERAPIST PROGRESS NOTE  Session Time: 60 min  Participation Level: Active  Behavioral Response: CasualAlertEuthymic  Type of Therapy: Individual Therapy  Treatment Goals addressed: Anxiety and Coping  Interventions: CBT and Motivational Interviewing  Summary: Meredith Williams is a 38 y.o. female who presents with  continued symptoms of her diagnosis.   Therapist met with Patient in an outpatient setting to assess current mood and assist with making progress towards goals through the use of therapeutic intervention. Therapist did a brief mood check, assessing anger, fear, disgust, excitement, happiness, and sadness.  Patient reports "moody" mood.  Patient reports that she is stressed out by her mother.  SHe was able to recall events in her life that causes her stress.  Explored coping strategies that have worked and strategies have not worked.  Assisted with strategizing a daily routine to reduce symptoms   Suicidal/Homicidal: No  Plan: Return again in 2 weeks.  Diagnosis: Axis I: Depression    Axis II: No diagnosis    Lubertha South, LCSW 01/23/2018

## 2018-01-30 ENCOUNTER — Ambulatory Visit (INDEPENDENT_AMBULATORY_CARE_PROVIDER_SITE_OTHER): Payer: Medicare HMO | Admitting: Psychiatry

## 2018-01-30 ENCOUNTER — Other Ambulatory Visit: Payer: Self-pay | Admitting: Psychiatry

## 2018-01-30 ENCOUNTER — Other Ambulatory Visit: Payer: Self-pay

## 2018-01-30 ENCOUNTER — Encounter: Payer: Self-pay | Admitting: Psychiatry

## 2018-01-30 VITALS — BP 105/69 | HR 98 | Temp 98.0°F | Wt 220.2 lb

## 2018-01-30 DIAGNOSIS — F331 Major depressive disorder, recurrent, moderate: Secondary | ICD-10-CM

## 2018-01-30 DIAGNOSIS — F4010 Social phobia, unspecified: Secondary | ICD-10-CM

## 2018-01-30 DIAGNOSIS — F411 Generalized anxiety disorder: Secondary | ICD-10-CM | POA: Diagnosis not present

## 2018-01-30 DIAGNOSIS — F5105 Insomnia due to other mental disorder: Secondary | ICD-10-CM | POA: Diagnosis not present

## 2018-01-30 MED ORDER — LAMOTRIGINE 25 MG PO TABS: 25.0000 mg | ORAL_TABLET | Freq: Every day | ORAL | 0 refills | Status: DC
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
## 2018-01-30 NOTE — Patient Instructions (Signed)
Lamotrigine tablets What is this medicine? LAMOTRIGINE (la MOE tri jeen) is used to control seizures in adults and children with epilepsy and Lennox-Gastaut syndrome. It is also used in adults to treat bipolar disorder. This medicine may be used for other purposes; ask your health care provider or pharmacist if you have questions. COMMON BRAND NAME(S): Lamictal, Subvenite What should I tell my health care provider before I take this medicine? They need to know if you have any of these conditions: -aseptic meningitis during prior use of lamotrigine -depression -folate deficiency -kidney disease -liver disease -suicidal thoughts, plans, or attempt; a previous suicide attempt by you or a family member -an unusual or allergic reaction to lamotrigine or other seizure medications, other medicines, foods, dyes, or preservatives -pregnant or trying to get pregnant -breast-feeding How should I use this medicine? Take this medicine by mouth with a glass of water. Follow the directions on the prescription label. Do not chew these tablets. If this medicine upsets your stomach, take it with food or milk. Take your doses at regular intervals. Do not take your medicine more often than directed. A special MedGuide will be given to you by the pharmacist with each new prescription and refill. Be sure to read this information carefully each time. Talk to your pediatrician regarding the use of this medicine in children. While this drug may be prescribed for children as young as 2 years for selected conditions, precautions do apply. Overdosage: If you think you have taken too much of this medicine contact a poison control center or emergency room at once. NOTE: This medicine is only for you. Do not share this medicine with others. What if I miss a dose? If you miss a dose, take it as soon as you can. If it is almost time for your next dose, take only that dose. Do not take double or extra doses. What may interact  with this medicine? -atazanavir -carbamazepine -female hormones, including contraceptive or birth control pills -lopinavir -methotrexate -phenobarbital -phenytoin -primidone -pyrimethamine -rifampin -ritonavir -trimethoprim -valproic acid This list may not describe all possible interactions. Give your health care provider a list of all the medicines, herbs, non-prescription drugs, or dietary supplements you use. Also tell them if you smoke, drink alcohol, or use illegal drugs. Some items may interact with your medicine. What should I watch for while using this medicine? Visit your doctor or health care professional for regular checks on your progress. If you take this medicine for seizures, wear a Medic Alert bracelet or necklace. Carry an identification card with information about your condition, medicines, and doctor or health care professional. It is important to take this medicine exactly as directed. When first starting treatment, your dose will need to be adjusted slowly. It may take weeks or months before your dose is stable. You should contact your doctor or health care professional if your seizures get worse or if you have any new types of seizures. Do not stop taking this medicine unless instructed by your doctor or health care professional. Stopping your medicine suddenly can increase your seizures or their severity. Contact your doctor or health care professional right away if you develop a rash while taking this medicine. Rashes may be very severe and sometimes require treatment in the hospital. Deaths from rashes have occurred. Serious rashes occur more often in children than adults taking this medicine. It is more common for these serious rashes to occur during the first 2 months of treatment, but a rash can occur at   any time. You may get drowsy, dizzy, or have blurred vision. Do not drive, use machinery, or do anything that needs mental alertness until you know how this medicine  affects you. To reduce dizzy or fainting spells, do not sit or stand up quickly, especially if you are an older patient. Alcohol can increase drowsiness and dizziness. Avoid alcoholic drinks. If you are taking this medicine for bipolar disorder, it is important to report any changes in your mood to your doctor or health care professional. If your condition gets worse, you get mentally depressed, feel very hyperactive or manic, have difficulty sleeping, or have thoughts of hurting yourself or committing suicide, you need to get help from your health care professional right away. If you are a caregiver for someone taking this medicine for bipolar disorder, you should also report these behavioral changes right away. The use of this medicine may increase the chance of suicidal thoughts or actions. Pay special attention to how you are responding while on this medicine. Your mouth may get dry. Chewing sugarless gum or sucking hard candy, and drinking plenty of water may help. Contact your doctor if the problem does not go away or is severe. Women who become pregnant while using this medicine may enroll in the [place] Antiepileptic Drug Pregnancy Registry by calling 1-888-233-2334. This registry collects information about the safety of antiepileptic drug use during pregnancy. This medicine may cause a decrease in folic acid. You should make sure that you get enough folic acid while you are taking this medicine. Discuss the foods you eat and the vitamins you take with your health care professional. What side effects may I notice from receiving this medicine? Side effects that you should report to your doctor or health care professional as soon as possible: -allergic reactions like skin rash, itching or hives, swelling of the face, lips, or tongue -changes in vision -depressed mood -elevated mood, decreased need for sleep, racing thoughts, impulsive behavior -fever with rash, swollen lymph nodes, or  swelling of the face -loss of balance or coordination -mouth sores -redness, blistering, peeling or loosening of the skin, including inside the mouth -right upper belly pain -seizures -severe muscle pain -signs and symptoms of aseptic meningitis such as stiff neck and sensitivity to light, headache, drowsiness, fever, nausea, vomiting, rash -signs of infection - fever or chills, cough, sore throat, pain or difficulty passing urine -suicidal thoughts or other mood changes -swollen lymph nodes -trouble walking -unusual bruising or bleeding -unusually weak or tired -yellowing of the eyes or skin Side effects that usually do not require medical attention (report to your doctor or health care professional if they continue or are bothersome): -diarrhea -dizziness -dry mouth -stuffy nose -tiredness -tremors -trouble sleeping This list may not describe all possible side effects. Call your doctor for medical advice about side effects. You may report side effects to FDA at 1-800-FDA-1088. Where should I keep my medicine? Keep out of reach of children. Store at room temperature between 15 and 30 degrees C (59 and 86 degrees F). Throw away any unused medicine after the expiration date. NOTE: This sheet is a summary. It may not cover all possible information. If you have questions about this medicine, talk to your doctor, pharmacist, or health care provider.  2019 [employer] (2016-08-27 16:07:39)  

## 2018-01-31 ENCOUNTER — Other Ambulatory Visit: Payer: Self-pay | Admitting: Psychiatry

## 2018-01-31 DIAGNOSIS — F4010 Social phobia, unspecified: Secondary | ICD-10-CM

## 2018-01-31 DIAGNOSIS — F411 Generalized anxiety disorder: Secondary | ICD-10-CM

## 2018-01-31 DIAGNOSIS — F331 Major depressive disorder, recurrent, moderate: Secondary | ICD-10-CM

## 2018-02-03 ENCOUNTER — Other Ambulatory Visit: Payer: Self-pay | Admitting: Psychiatry

## 2018-02-03 DIAGNOSIS — F331 Major depressive disorder, recurrent, moderate: Secondary | ICD-10-CM

## 2018-02-11 ENCOUNTER — Other Ambulatory Visit: Payer: Self-pay | Admitting: Student in an Organized Health Care Education/Training Program

## 2018-02-16 ENCOUNTER — Other Ambulatory Visit: Payer: Self-pay | Admitting: Psychiatry

## 2018-02-18 ENCOUNTER — Other Ambulatory Visit: Payer: Self-pay

## 2018-02-18 ENCOUNTER — Ambulatory Visit
Payer: Medicare HMO | Attending: Student in an Organized Health Care Education/Training Program | Admitting: Student in an Organized Health Care Education/Training Program

## 2018-02-18 ENCOUNTER — Encounter: Payer: Self-pay | Admitting: Student in an Organized Health Care Education/Training Program

## 2018-02-18 VITALS — BP 103/74 | HR 93 | Temp 98.2°F | Resp 18 | Ht 67.0 in | Wt 221.0 lb

## 2018-02-18 DIAGNOSIS — G7100 Muscular dystrophy, unspecified: Secondary | ICD-10-CM | POA: Diagnosis not present

## 2018-02-18 DIAGNOSIS — G43719 Chronic migraine without aura, intractable, without status migrainosus: Secondary | ICD-10-CM

## 2018-02-18 DIAGNOSIS — F331 Major depressive disorder, recurrent, moderate: Secondary | ICD-10-CM

## 2018-02-18 DIAGNOSIS — M545 Low back pain: Secondary | ICD-10-CM | POA: Insufficient documentation

## 2018-02-18 DIAGNOSIS — G894 Chronic pain syndrome: Secondary | ICD-10-CM

## 2018-02-18 DIAGNOSIS — G8929 Other chronic pain: Secondary | ICD-10-CM | POA: Diagnosis not present

## 2018-02-18 DIAGNOSIS — M791 Myalgia, unspecified site: Secondary | ICD-10-CM

## 2018-02-18 MED ORDER — PREGABALIN 100 MG PO CAPS: ORAL_CAPSULE | ORAL | 5 refills | Status: DC

## 2018-02-18 MED ORDER — ORPHENADRINE CITRATE 30 MG/ML IJ SOLN
30.0000 mg | Freq: Once | INTRAMUSCULAR | Status: AC
  Administered 2018-02-18: 30 mg via INTRAMUSCULAR
  Filled 2018-02-18: qty 2

## 2018-02-18 MED ORDER — KETOROLAC TROMETHAMINE 30 MG/ML IJ SOLN
30.0000 mg | Freq: Once | INTRAMUSCULAR | Status: AC
  Administered 2018-02-18: 30 mg via INTRAMUSCULAR
  Filled 2018-02-18: qty 1

## 2018-02-18 NOTE — Patient Instructions (Signed)
Lyrica has been sent to  Your pharmacy.  You have been given an injection of Toradol and Norflex today.

## 2018-02-18 NOTE — Progress Notes (Signed)
Safety precautions to be maintained throughout the outpatient stay will include: orient to surroundings, keep bed in low position, maintain call bell within reach at all times, provide assistance with transfer out of bed and ambulation.  
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
## 2018-02-19 DIAGNOSIS — G4733 Obstructive sleep apnea (adult) (pediatric): Secondary | ICD-10-CM | POA: Diagnosis not present

## 2018-02-20 ENCOUNTER — Ambulatory Visit (INDEPENDENT_AMBULATORY_CARE_PROVIDER_SITE_OTHER): Payer: Medicare HMO | Admitting: Licensed Clinical Social Worker

## 2018-02-20 DIAGNOSIS — F411 Generalized anxiety disorder: Secondary | ICD-10-CM

## 2018-02-20 DIAGNOSIS — F331 Major depressive disorder, recurrent, moderate: Secondary | ICD-10-CM | POA: Diagnosis not present

## 2018-02-21 DIAGNOSIS — M7989 Other specified soft tissue disorders: Secondary | ICD-10-CM | POA: Diagnosis not present

## 2018-02-25 DIAGNOSIS — G894 Chronic pain syndrome: Secondary | ICD-10-CM | POA: Diagnosis not present

## 2018-02-25 DIAGNOSIS — G7111 Myotonic muscular dystrophy: Secondary | ICD-10-CM | POA: Diagnosis not present

## 2018-02-25 DIAGNOSIS — G43719 Chronic migraine without aura, intractable, without status migrainosus: Secondary | ICD-10-CM | POA: Diagnosis not present

## 2018-02-25 DIAGNOSIS — G4459 Other complicated headache syndrome: Secondary | ICD-10-CM | POA: Diagnosis not present

## 2018-02-27 ENCOUNTER — Other Ambulatory Visit: Payer: Self-pay

## 2018-02-27 ENCOUNTER — Telehealth: Payer: Self-pay | Admitting: Student in an Organized Health Care Education/Training Program

## 2018-02-27 ENCOUNTER — Ambulatory Visit (INDEPENDENT_AMBULATORY_CARE_PROVIDER_SITE_OTHER): Payer: Medicare HMO | Admitting: Psychiatry

## 2018-02-27 ENCOUNTER — Encounter: Payer: Self-pay | Admitting: Psychiatry

## 2018-02-27 VITALS — BP 117/79 | HR 108 | Temp 98.4°F | Wt 224.8 lb

## 2018-02-27 DIAGNOSIS — F4010 Social phobia, unspecified: Secondary | ICD-10-CM

## 2018-02-27 DIAGNOSIS — F331 Major depressive disorder, recurrent, moderate: Secondary | ICD-10-CM

## 2018-02-27 DIAGNOSIS — F5105 Insomnia due to other mental disorder: Secondary | ICD-10-CM

## 2018-02-27 DIAGNOSIS — F411 Generalized anxiety disorder: Secondary | ICD-10-CM | POA: Diagnosis not present

## 2018-02-27 NOTE — Progress Notes (Signed)
BH MD OP Progress Note  02/27/2018 12:06 PM Meredith Williams  MRN:  [id number]  Chief Complaint: ' I am here for follow up.' Chief Complaint    Follow-up     HPI: Meredith Williams is a 38 year old Caucasian female, single, lives in [place], on [place], presented to the clinic today for a follow-up visit.  Patient has a history of depression, anxiety, insomnia, myotonic dystrophy, diabetes melitis and migraine headaches.  Patient today reports her medications are beneficial for her mood symptoms.  She denies any significant depression or anxiety symptoms at this time.  She denies any side effects to her medications.  She reports she is compliant on them as prescribed.  She reports sleep is improved on the medications she is on.  She continues to be taking Ambien.  She is also on Pamelor prescribed by neurology for pain and headaches.  Patient reports she has several medical problems going on including ankle swelling as well as migraine headaches.  She also had a fall recently and is scheduled to get  MRI scan of her brain.  She reports she continues to work with neurology for the same.  She is also on Lasix now for the ankle swelling she just started today.  Patient denies any suicidality, homicidality or perceptual disturbances.  Patient denies any other concerns today. Visit Diagnosis:    ICD-10-CM   1. MDD (major depressive disorder), recurrent episode, moderate (HCC) F33.1   2. GAD (generalized anxiety disorder) F41.1   3. Social anxiety disorder F40.10   4. Insomnia due to mental disorder F51.05     Past Psychiatric History: I have reviewed past psychiatric history from my progress note on 04/16/2017.  Past trials of Cymbalta-ineffective, Viibryd, BuSpar, Effexor  Past Medical History:  Past Medical History:  Diagnosis Date  . Anxiety   . Depression   . Diabetes mellitus without complication (HCC)   . Hyperlipidemia   . Muscular dystrophy, myotonic (HCC)   . Pancreatitis     Past Surgical  History:  Procedure Laterality Date  . ABDOMINAL HYSTERECTOMY    . HIP SURGERY Right 2016   "removal of a bone growth"    Family Psychiatric History: I have reviewed family psychiatric history from my progress note on 04/16/2017  Family History:  Family History  Problem Relation Age of Onset  . Stroke Mother   . Muscular dystrophy Mother   . Drug abuse Brother   . Depression Sister   . Anxiety disorder Sister   . Muscular dystrophy Sister   . Depression Sister     Social History: Reviewed social history from my progress note on 04/16/2017. Social History   Socioeconomic History  . Marital status: Single    Spouse name: Not on file  . Number of children: 0  . Years of education: Not on file  . Highest education level: Some college, no degree  Occupational History    Comment: disability  Social Needs  . Financial resource strain: Not hard at all  . Food insecurity:    Worry: Never true    Inability: Never true  . Transportation needs:    Medical: Yes    Non-medical: Yes  Tobacco Use  . Smoking status: Passive Smoke Exposure - Never Smoker  . Smokeless tobacco: Never Used  Substance and Sexual Activity  . Alcohol use: No    Alcohol/week: 0.0 standard drinks    Frequency: Never  . Drug use: No  . Sexual activity: Yes    Birth control/protection:  Pill  Lifestyle  . Physical activity:    Days per week: Not on file    Minutes per session: Not on file  . Stress: Very much  Relationships  . Social connections:    Talks on phone: More than three times a week    Gets together: More than three times a week    Attends religious service: Never    Active member of club or organization: No    Attends meetings of clubs or organizations: Never    Relationship status: Never married  Other Topics Concern  . Not on file  Social History Narrative   Patient went to the ED d/t pain in her side.  Every time she eats, she has increased pain.  Ob/gyn reported after CT that she  had an ovarian cyst.  She will be going ob/gyn today for an injection to help with this.     Allergies:  Allergies  Allergen Reactions  . Other Other (See Comments)    Patient states that she is allergic to any medication that is in a patch form as it will "go into her muscle."    Metabolic Disorder Labs: No results found for: HGBA1C, MPG No results found for: PROLACTIN Lab Results  Component Value Date   CHOL 168 06/15/2016   TRIG 285 (H) 06/15/2016   HDL 44 06/15/2016   CHOLHDL 3.8 06/15/2016   VLDL 57 (H) 06/15/2016   LDLCALC 67 06/15/2016   No results found for: TSH  Therapeutic Level Labs: No results found for: LITHIUM No results found for: VALPROATE No components found for:  CBMZ  Current Medications: Current Outpatient Medications  Medication Sig Dispense Refill  . aspirin EC 81 MG tablet Take 81 mg by mouth daily.    . cyclobenzaprine (FLEXERIL) 10 MG tablet Take 15 mg twice a day    . fenofibrate 160 MG tablet Take 160 mg by mouth daily.  0  . FLUoxetine (PROZAC) 40 MG capsule Take 2 capsules (80 mg total) by mouth daily. 180 capsule 1  . fluticasone (FLONASE) 50 MCG/ACT nasal spray Place into the nose.    . furosemide (LASIX) 20 MG tablet TAKE 1 TABLET(20 MG) BY MOUTH EVERY DAY AS NEEDED FOR SWELLING    . JUNEL FE 24 1-20 MG-MCG(24) tablet Take 1 tablet by mouth daily.  0  . lamoTRIgine (LAMICTAL) 25 MG tablet TAKE 1 TABLET(25 MG) BY MOUTH DAILY. START TAKING 25 MG FOR 2 WEEKS AND THEN. INCREASE TO 50 MG AFTER 2 WEEKS 180 tablet 0  . metFORMIN (GLUCOPHAGE) 1000 MG tablet   0  . metFORMIN (GLUCOPHAGE) 1000 MG tablet TAKE 1 TABLET BY MOUTH TWICE A DAY WITH FOOD    . niacin (NIASPAN) 500 MG CR tablet Take by mouth.    . norgestrel-ethinyl estradiol (OGESTREL) 0.5-50 MG-MCG tablet Take by mouth.    . nortriptyline (PAMELOR) 50 MG capsule Take by mouth.    . pregabalin (LYRICA) 100 MG capsule 100 mg in the qAM, 200 mg qPM 90 capsule 5  . propranolol (INDERAL) 10 MG  tablet TAKE 1 TABLET(10 MG) BY MOUTH THREE TIMES DAILY AS NEEDED FOR SEVERE ANXIETY OR SYMPTOMS 270 tablet 1  . TRESIBA FLEXTOUCH 100 UNIT/ML SOPN FlexTouch Pen INJECT 50 UNITS SUBCUTANEOUSLY ONCE DAILY  11  . triamcinolone cream (KENALOG) 0.5 % Apply topically.    Marland Kitchen zolpidem (AMBIEN) 10 MG tablet TAKE 1 TABLET(10 MG) BY MOUTH AT BEDTIME AS NEEDED FOR SLEEP 30 tablet 2   No current  facility-administered medications for this visit.      Musculoskeletal: Strength & Muscle Tone: within normal limits Gait & Station: normal Patient leans: N/A  Psychiatric Specialty Exam: Review of Systems  Cardiovascular: Positive for leg swelling.  Psychiatric/Behavioral: Negative for depression. The patient is not nervous/anxious and does not have insomnia.   All other systems reviewed and are negative.   Blood pressure 117/79, pulse (!) 108, temperature 98.4 F (36.9 C), temperature source Oral, weight 224 lb 12.8 oz (102 kg).Body mass index is 35.21 kg/m.  General Appearance: Casual  Eye Contact:  Fair  Speech:  Clear and Coherent  Volume:  Normal  Mood:  Depressed improving  Affect:  Congruent  Thought Process:  Goal Directed and Descriptions of Associations: Intact  Orientation:  Full (Time, Place, and Person)  Thought Content: Logical   Suicidal Thoughts:  No  Homicidal Thoughts:  No  Memory:  Immediate;   Fair Recent;   Fair Remote;   Fair  Judgement:  Fair  Insight:  Fair  Psychomotor Activity:  Normal  Concentration:  Concentration: Good and Attention Span: Fair  Recall:  [employer]  Fund of Knowledge: Fair  Language: Fair  Akathisia:  No  Handed:  Right  AIMS (if indicated): denies tremors, rigidity  Assets:  Communication Skills Desire for Improvement Housing  ADL's:  Intact  Cognition: WNL  Sleep:  improved   Screenings: PHQ2-9     Clinical [facility] Clinical [facility] Clinical [facility]/11[facility] Clinical [facility]  PHQ-2 Total Score  3  3  0  0  0  PHQ-9 Total Score  12  11  -  -  -       Assessment and Plan: Maralyn SagoSarah is a 38 year old Caucasian female who has a history of depression, anxiety, chronic pain, myotonic dystrophy, recent diagnosis of diabetes, migraine headaches, presented to clinic today for a follow-up visit.  Patient continues to do well on the current medication regimen.  She denies any side effects.  She does continue to struggle with medical problems including swelling of her ankles and is currently on medications for the same.  She will continue to work with her other providers on the same.  Discussed plan as noted below.  Plan MDD-improving Prozac 80 mg p.o. daily Continue Pamelor prescribed per neurology. Continue Lamictal 50 mg p.o. daily.  For anxiety disorder-improving Prozac as prescribed Propranolol 10 mg p.o. 3 times daily as needed  For insomnia-improving Ambien 10 mg p.o. nightly Pamelor will also help. Continue CPAP  For bereavement-improving She will continue psychotherapy sessions with Ms. Peacock.  Rule out PTSD Patient will continue to work with Ms. Peacock.  Follow-up in clinic in 6 weeks or sooner if needed.  I have spent atleast 15 minutes face to face with patient today. More than 50 % of the time was spent for psychoeducation and supportive psychotherapy and care coordination.  This note was generated in part or whole with voice recognition software. Voice recognition is usually quite accurate but there are transcription errors that can and very often do occur. I apologize for any typographical errors that were not detected and  corrected.         Jomarie LongsSaramma Joslyn Ramos, MD 02/27/2018,  12:06 PM

## 2018-02-27 NOTE — Telephone Encounter (Signed)
Pt came into office and dropped an envelope off and stated that her insurance is saying she needs a different medication. Envelope left in bin where we leave RX refills/requests.

## 2018-03-03 ENCOUNTER — Other Ambulatory Visit: Payer: Self-pay | Admitting: Neurology

## 2018-03-03 DIAGNOSIS — G43719 Chronic migraine without aura, intractable, without status migrainosus: Secondary | ICD-10-CM

## 2018-03-07 ENCOUNTER — Emergency Department: Payer: Medicare HMO

## 2018-03-07 ENCOUNTER — Emergency Department
Admission: EM | Admit: 2018-03-07 | Discharge: 2018-03-07 | Disposition: A | Discharge status: Home / Self Care | Payer: Medicare HMO | Attending: Emergency Medicine | Admitting: Emergency Medicine

## 2018-03-07 ENCOUNTER — Other Ambulatory Visit: Payer: Self-pay

## 2018-03-07 DIAGNOSIS — S40011A Contusion of right shoulder, initial encounter: Secondary | ICD-10-CM | POA: Diagnosis not present

## 2018-03-07 DIAGNOSIS — E785 Hyperlipidemia, unspecified: Secondary | ICD-10-CM | POA: Insufficient documentation

## 2018-03-07 DIAGNOSIS — S299XXA Unspecified injury of thorax, initial encounter: Secondary | ICD-10-CM | POA: Diagnosis not present

## 2018-03-07 DIAGNOSIS — R000 Tachycardia, unspecified: Secondary | ICD-10-CM | POA: Diagnosis not present

## 2018-03-07 DIAGNOSIS — S79911A Unspecified injury of right hip, initial encounter: Secondary | ICD-10-CM | POA: Diagnosis not present

## 2018-03-07 DIAGNOSIS — G7111 Myotonic muscular dystrophy: Secondary | ICD-10-CM | POA: Insufficient documentation

## 2018-03-07 DIAGNOSIS — Z79899 Other long term (current) drug therapy: Secondary | ICD-10-CM | POA: Diagnosis not present

## 2018-03-07 DIAGNOSIS — Y9389 Activity, other specified: Secondary | ICD-10-CM | POA: Insufficient documentation

## 2018-03-07 DIAGNOSIS — R55 Syncope and collapse: Secondary | ICD-10-CM | POA: Diagnosis not present

## 2018-03-07 DIAGNOSIS — M79671 Pain in right foot: Secondary | ICD-10-CM | POA: Diagnosis not present

## 2018-03-07 DIAGNOSIS — Y9289 Other specified places as the place of occurrence of the external cause: Secondary | ICD-10-CM | POA: Diagnosis not present

## 2018-03-07 DIAGNOSIS — S199XXA Unspecified injury of neck, initial encounter: Secondary | ICD-10-CM | POA: Diagnosis not present

## 2018-03-07 DIAGNOSIS — S99921A Unspecified injury of right foot, initial encounter: Secondary | ICD-10-CM | POA: Insufficient documentation

## 2018-03-07 DIAGNOSIS — M545 Low back pain: Secondary | ICD-10-CM | POA: Diagnosis not present

## 2018-03-07 DIAGNOSIS — W109XXA Fall (on) (from) unspecified stairs and steps, initial encounter: Secondary | ICD-10-CM | POA: Insufficient documentation

## 2018-03-07 DIAGNOSIS — E119 Type 2 diabetes mellitus without complications: Secondary | ICD-10-CM | POA: Diagnosis not present

## 2018-03-07 DIAGNOSIS — S4991XA Unspecified injury of right shoulder and upper arm, initial encounter: Secondary | ICD-10-CM | POA: Insufficient documentation

## 2018-03-07 DIAGNOSIS — Z7982 Long term (current) use of aspirin: Secondary | ICD-10-CM | POA: Diagnosis not present

## 2018-03-07 DIAGNOSIS — S1093XA Contusion of unspecified part of neck, initial encounter: Secondary | ICD-10-CM | POA: Diagnosis not present

## 2018-03-07 DIAGNOSIS — Z7722 Contact with and (suspected) exposure to environmental tobacco smoke (acute) (chronic): Secondary | ICD-10-CM | POA: Insufficient documentation

## 2018-03-07 DIAGNOSIS — M25551 Pain in right hip: Secondary | ICD-10-CM | POA: Diagnosis not present

## 2018-03-07 DIAGNOSIS — Y998 Other external cause status: Secondary | ICD-10-CM | POA: Diagnosis not present

## 2018-03-07 DIAGNOSIS — M25511 Pain in right shoulder: Secondary | ICD-10-CM | POA: Diagnosis not present

## 2018-03-07 DIAGNOSIS — S0990XA Unspecified injury of head, initial encounter: Secondary | ICD-10-CM | POA: Insufficient documentation

## 2018-03-07 DIAGNOSIS — R079 Chest pain, unspecified: Secondary | ICD-10-CM | POA: Diagnosis not present

## 2018-03-07 DIAGNOSIS — Z87828 Personal history of other (healed) physical injury and trauma: Secondary | ICD-10-CM | POA: Diagnosis not present

## 2018-03-07 DIAGNOSIS — T07XXXA Unspecified multiple injuries, initial encounter: Secondary | ICD-10-CM

## 2018-03-07 DIAGNOSIS — S300XXA Contusion of lower back and pelvis, initial encounter: Secondary | ICD-10-CM | POA: Diagnosis not present

## 2018-03-07 DIAGNOSIS — S7001XA Contusion of right hip, initial encounter: Secondary | ICD-10-CM | POA: Diagnosis not present

## 2018-03-07 LAB — HEPATIC FUNCTION PANEL
ALT: 24 U/L (ref 0–44)
AST: 25 U/L (ref 15–41)
Albumin: 3.6 g/dL (ref 3.5–5.0)
Alkaline Phosphatase: 28 U/L — ABNORMAL LOW (ref 38–126)
BILIRUBIN INDIRECT: 0.6 mg/dL (ref 0.3–0.9)
Bilirubin, Direct: 0.2 mg/dL (ref 0.0–0.2)
Total Bilirubin: 0.8 mg/dL (ref 0.3–1.2)
Total Protein: 6.8 g/dL (ref 6.5–8.1)

## 2018-03-07 LAB — DIFFERENTIAL
Abs Immature Granulocytes: 0.03 10*3/uL (ref 0.00–0.07)
Basophils Absolute: 0 10*3/uL (ref 0.0–0.1)
Basophils Relative: 0 %
Eosinophils Absolute: 0.1 10*3/uL (ref 0.0–0.5)
Eosinophils Relative: 2 %
Immature Granulocytes: 0 %
Lymphocytes Relative: 29 %
Lymphs Abs: 2 10*3/uL (ref 0.7–4.0)
Monocytes Absolute: 0.4 10*3/uL (ref 0.1–1.0)
Monocytes Relative: 6 %
Neutro Abs: 4.2 10*3/uL (ref 1.7–7.7)
Neutrophils Relative %: 63 %

## 2018-03-07 LAB — URINALYSIS, COMPLETE (UACMP) WITH MICROSCOPIC
Bilirubin Urine: NEGATIVE
Glucose, UA: NEGATIVE mg/dL
HGB URINE DIPSTICK: NEGATIVE
Ketones, ur: NEGATIVE mg/dL
Leukocytes,Ua: NEGATIVE
NITRITE: POSITIVE — AB
Protein, ur: NEGATIVE mg/dL
Specific Gravity, Urine: 1.024 (ref 1.005–1.030)
pH: 5 (ref 5.0–8.0)

## 2018-03-07 LAB — CBC
HCT: 47.8 % — ABNORMAL HIGH (ref 36.0–46.0)
HEMOGLOBIN: 15.4 g/dL — AB (ref 12.0–15.0)
MCH: 29.4 pg (ref 26.0–34.0)
MCHC: 32.2 g/dL (ref 30.0–36.0)
MCV: 91.4 fL (ref 80.0–100.0)
Platelets: 304 10*3/uL (ref 150–400)
RBC: 5.23 MIL/uL — ABNORMAL HIGH (ref 3.87–5.11)
RDW: 14 % (ref 11.5–15.5)
WBC: 6.7 10*3/uL (ref 4.0–10.5)
nRBC: 0 % (ref 0.0–0.2)

## 2018-03-07 LAB — BASIC METABOLIC PANEL
Anion gap: 7 (ref 5–15)
BUN: 28 mg/dL — AB (ref 6–20)
CO2: 34 mmol/L — ABNORMAL HIGH (ref 22–32)
Calcium: 9.5 mg/dL (ref 8.9–10.3)
Chloride: 99 mmol/L (ref 98–111)
Creatinine, Ser: 1.15 mg/dL — ABNORMAL HIGH (ref 0.44–1.00)
GFR calc Af Amer: >60 mL/min (ref >60)
GFR calc non Af Amer: >60 mL/min (ref >60)
Glucose, Bld: 112 mg/dL — ABNORMAL HIGH (ref 70–99)
Potassium: 3.6 mmol/L (ref 3.5–5.1)
Sodium: 140 mmol/L (ref 135–145)

## 2018-03-07 LAB — TSH: TSH: 0.9 u[IU]/mL (ref 0.350–4.500)

## 2018-03-07 LAB — BRAIN NATRIURETIC PEPTIDE: B Natriuretic Peptide: 19 pg/mL (ref 0.0–100.0)

## 2018-03-07 LAB — TROPONIN I

## 2018-03-07 LAB — PREGNANCY, URINE: Preg Test, Ur: NEGATIVE

## 2018-03-07 MED ORDER — SODIUM CHLORIDE 0.9% FLUSH: 3.0000 mL | Freq: Once | INTRAVENOUS | Status: DC

## 2018-03-07 NOTE — Discharge Instructions (Signed)
Follow-up with your regular doctor.  Please use Motrin or Tylenol for the pain.  You can take 3 of the over-the-counter Motrin 3 times a day with food.  Do that for 3 or 4 days.  Return here for any further problems.

## 2018-03-07 NOTE — ED Notes (Signed)
  Pt transported to ct 

## 2018-03-07 NOTE — ED Notes (Signed)
Pt o2 sats at 85 on room air, pt refused nasal cannula. Pt placed on non rebreather mask per request of pt.

## 2018-03-07 NOTE — ED Provider Notes (Signed)
[facility] Emergency Department Provider Note   ____________________________________________   First MD Initiated Contact with Patient 03/07/18 1503     (approximate)  I have reviewed the triage vital signs and the nursing notes.   HISTORY  Chief Complaint Loss of Consciousness and Fall    HPI Meredith Williams is a 38 y.o. female who reports she fell walking up the stairs on Thursday woke up with the bottom of the stairs.  She complains of some headache neck pain pain in the right shoulder right hip low back and the right heel.  She went to see her doctor Dr. Burnadette PopLinthavong today and was told to come over to get a CAT scan of her head.  He was also told to get some x-rays.  Patient reports she is passed out several times in the last year or so.  Seems to be with exertion.  Patient also reports when she goes to the ER she is always been on oxygen because her oxygen level is low.  She has muscular dystrophy.  He has diabetes depression and pancreatitis in the past.  Past Medical History:  Diagnosis Date  . Anxiety   . Depression   . Diabetes mellitus without complication (HCC)   . Hyperlipidemia   . Muscular dystrophy, myotonic (HCC)   . Pancreatitis     Patient Active Problem List   Diagnosis Date Noted  . OSA (obstructive sleep apnea) 06/25/2017  . Sweating abnormality 05/28/2017  . MDD (major depressive disorder), recurrent episode, moderate (HCC) 04/17/2017  . Muscular dystrophy (HCC) 04/11/2017  . Chronic bilateral low back pain without sciatica 04/11/2017  . Myalgia 04/11/2017  . Insomnia 01/04/2017  . Depression 01/04/2017  . Anxiety 01/04/2017  . Diabetes mellitus without complication (HCC) 09/13/2016  . Pancreatitis 06/22/2016  . Acute pancreatitis 06/15/2016  . Vitamin D deficiency 09/27/2015  . Vitamin B12 deficiency 09/27/2015  . Hypertriglyceridemia 01/04/2015  . Intractable chronic migraine without aura and without status migrainosus  08/09/2014  . Right hip impingement syndrome 03/22/2014  . Primary osteoarthritis of right hip 10/21/2013  . Cocaine use 07/23/2013  . Encounter for therapeutic drug monitoring 03/26/2013  . Chronic pain syndrome 03/26/2013  . Raynaud phenomenon 03/26/2013  . Myotonic muscular dystrophy (HCC) 03/26/2013  . Raynaud's syndrome without gangrene 03/26/2013    Past Surgical History:  Procedure Laterality Date  . ABDOMINAL HYSTERECTOMY    . HIP SURGERY Right 2016   "removal of a bone growth"    Prior to Admission medications   Medication Sig Start Date End Date Taking? Authorizing Provider  aspirin EC 81 MG tablet Take 81 mg by mouth daily.    [provider]  cyclobenzaprine (FLEXERIL) 10 MG tablet Take 15 mg twice a day 02/25/18   [provider]  fenofibrate 160 MG tablet Take 160 mg by mouth daily. 06/05/16   [provider]  FLUoxetine (PROZAC) 40 MG capsule Take 2 capsules (80 mg total) by mouth daily. 01/02/18   Jomarie LongsEappen, Saramma, MD  fluticasone (FLONASE) 50 MCG/ACT nasal spray Place into the nose.    [provider]  furosemide (LASIX) 20 MG tablet TAKE 1 TABLET(20 MG) BY MOUTH EVERY DAY AS NEEDED FOR SWELLING 02/27/18   [provider]  JUNEL FE 24 1-20 MG-MCG(24) tablet Take 1 tablet by mouth daily. 12/25/16   [provider]  lamoTRIgine (LAMICTAL) 25 MG tablet TAKE 1 TABLET(25 MG) BY MOUTH DAILY. START TAKING 25 MG FOR 2 WEEKS AND THEN. INCREASE  TO 50 MG AFTER 2 WEEKS 01/31/18   Jomarie Longs, MD  metFORMIN (GLUCOPHAGE) 1000 MG tablet  05/13/17   [provider]  metFORMIN (GLUCOPHAGE) 1000 MG tablet TAKE 1 TABLET BY MOUTH TWICE A DAY WITH FOOD 12/26/17   [provider]  niacin (NIASPAN) 500 MG CR tablet Take by mouth. 08/14/17 08/14/18  [provider]  norgestrel-ethinyl estradiol (OGESTREL) 0.5-50 MG-MCG tablet Take by mouth. 05/22/17 05/22/18  [provider]  nortriptyline (PAMELOR) 50 MG capsule  Take by mouth. 02/25/18   [provider]  pregabalin (LYRICA) 100 MG capsule 100 mg in the qAM, 200 mg qPM 02/18/18   Edward Jolly, MD  propranolol (INDERAL) 10 MG tablet TAKE 1 TABLET(10 MG) BY MOUTH THREE TIMES DAILY AS NEEDED FOR SEVERE ANXIETY OR SYMPTOMS 09/26/17   Eappen, Levin Bacon, MD  TRESIBA FLEXTOUCH 100 UNIT/ML SOPN FlexTouch Pen INJECT 50 UNITS SUBCUTANEOUSLY ONCE DAILY 11/17/17   [provider]  triamcinolone cream (KENALOG) 0.5 % Apply topically. 08/14/17 08/14/18  [provider]  zolpidem (AMBIEN) 10 MG tablet TAKE 1 TABLET(10 MG) BY MOUTH AT BEDTIME AS NEEDED FOR SLEEP 02/17/18   Jomarie Longs, MD    Allergies Other  Family History  Problem Relation Age of Onset  . Stroke Mother   . Muscular dystrophy Mother   . Drug abuse Brother   . Depression Sister   . Anxiety disorder Sister   . Muscular dystrophy Sister   . Depression Sister     Social History Social History   Tobacco Use  . Smoking status: Passive Smoke Exposure - Never Smoker  . Smokeless tobacco: Never Used  Substance Use Topics  . Alcohol use: No    Alcohol/week: 0.0 standard drinks    Frequency: Never  . Drug use: No    Review of Systems  Constitutional: No fever/chills Eyes: No visual changes. ENT: No sore throat. Cardiovascular: Denies chest pain. Respiratory: Denies shortness of breath. Gastrointestinal: No abdominal pain.  No nausea, no vomiting.  No diarrhea.  No constipation. Genitourinary: Negative for dysuria. Musculoskeletal:  back pain. Skin: Negative for rash. Neurological: Negative for new focal weakness  ____________________________________________   PHYSICAL EXAM:  VITAL SIGNS: ED Triage Vitals  Enc Vitals Group     BP 03/07/18 1332 113/66     Pulse Rate 03/07/18 1332 (!) 108     Resp 03/07/18 1332 17     Temp 03/07/18 1332 98 F (36.7 C)     Temp Source 03/07/18 1332 Oral     SpO2 03/07/18 1332 (!) 88 %     Weight 03/07/18 1334 208 lb (94.3  kg)     Height 03/07/18 1334 5\' 7"  (1.702 m)     Head Circumference --      Peak Flow --      Pain Score 03/07/18 1333 8     Pain Loc --      Pain Edu? --      Excl. in GC? --     Constitutional: Alert and oriented. Well appearing and in no acute distress. Eyes: Conjunctivae are normal. PER. EOMI. Head: Atraumatic. Nose: No congestion/rhinnorhea. Mouth/Throat: Mucous membranes are moist.  Oropharynx non-erythematous. Neck: No stridor.  Mild diffuse cervical spine tenderness to palpation. Cardiovascular: Normal rate, regular rhythm. Grossly normal heart sounds.  Good peripheral circulation. Respiratory: Normal respiratory effort.  No retractions. Lungs CTAB. Gastrointestinal: Soft and nontender. No distention. No abdominal bruits. No CVA tenderness. Musculoskeletal: No lower extremity tenderness nor edema.   Neurologic:  Normal speech and language. No gross focal neurologic deficits are appreciated.  Specifically cranial nerves II through XII are intact visual fields were not checked finger-to-nose rapid alternating movements and hands are normal motor strength is 5/5 throughout patient does not report any numbness. Skin:  Skin is warm, dry and intact. No rash noted.   ____________________________________________   LABS (all labs ordered are listed, but only abnormal results are displayed)  Labs Reviewed  CBC - Abnormal; Notable for the following components:      Result Value   RBC 5.23 (*)    Hemoglobin 15.4 (*)    HCT 47.8 (*)    All other components within normal limits  URINALYSIS, COMPLETE (UACMP) WITH MICROSCOPIC - Abnormal; Notable for the following components:   Color, Urine YELLOW (*)    APPearance HAZY (*)    Nitrite POSITIVE (*)    Bacteria, UA MANY (*)    All other components within normal limits  BASIC METABOLIC PANEL - Abnormal; Notable for the following components:   CO2 34 (*)    Glucose, Bld 112 (*)    BUN 28 (*)    Creatinine, Ser 1.15 (*)    All other  components within normal limits  HEPATIC FUNCTION PANEL - Abnormal; Notable for the following components:   Alkaline Phosphatase 28 (*)    All other components within normal limits  TROPONIN I  BRAIN NATRIURETIC PEPTIDE  PREGNANCY, URINE  DIFFERENTIAL  TSH  CBG MONITORING, ED   ____________________________________________  EKG EKG read interpreted by me shows sinus tachycardia rate of 108 left axis decreased R wave progression no acute ST-T wave changes are seen  ____________________________________________  RADIOLOGY  ED MD interpretation: CT of the head and neck read by radiology reviewed by me showed no acute disease  Official radiology report(s): Dg Chest 2 View  Result Date: 03/07/2018 CLINICAL DATA:  Pain after fall down stairs. EXAM: CHEST - 2 VIEW COMPARISON:  Radiographs of December 08, 2017. FINDINGS: The heart size and mediastinal contours are within normal limits. No pneumothorax or pleural effusion is noted. Stable bibasilar subsegmental atelectasis or scarring is noted. The visualized skeletal structures are unremarkable. IMPRESSION: Stable bibasilar subsegmental atelectasis or scarring. Electronically Signed   By: Lupita Raider, M.D.   On: 03/07/2018 16:26   Dg Lumbar Spine Complete  Result Date: 03/07/2018 CLINICAL DATA:  Low back pain after fall down stairs. EXAM: LUMBAR SPINE - COMPLETE 4+ VIEW COMPARISON:  CT scan of December 08, 2017. FINDINGS: There is no evidence of lumbar spine fracture. Alignment is normal. Intervertebral disc spaces are maintained. IMPRESSION: Negative. Electronically Signed   By: Lupita Raider, M.D.   On: 03/07/2018 16:33   Dg Shoulder Right  Result Date: 03/07/2018 CLINICAL DATA:  Right shoulder pain after fall down stairs. EXAM: RIGHT SHOULDER - 2+ VIEW COMPARISON:  None. FINDINGS: There is no evidence of fracture or dislocation. There is no evidence of arthropathy or other focal bone abnormality. Soft tissues are unremarkable.  IMPRESSION: Negative. Electronically Signed   By: Lupita Raider, M.D.   On: 03/07/2018 16:27   Dg Os Calcis Right  Result Date: 03/07/2018 CLINICAL DATA:  Right heel pain after fall down stairs. EXAM: RIGHT OS CALCIS - 2+ VIEW COMPARISON:  None. FINDINGS: There is no evidence of fracture. Mild posterior calcaneal spurring is noted. Soft tissues are unremarkable. IMPRESSION: No acute abnormality seen in the right calcaneus. Electronically Signed   By: Lupita Raider, M.D.  On: 03/07/2018 16:32   Ct Head Wo Contrast  Result Date: 03/07/2018 CLINICAL DATA:  Head trauma, at passed out going up 14 steps and struck her head Wednesday night at 0130 hours, aching all over, history diabetes mellitus, bruising to LEFT side of forehead EXAM: CT HEAD WITHOUT CONTRAST CT CERVICAL SPINE WITHOUT CONTRAST TECHNIQUE: Multidetector CT imaging of the head and cervical spine was performed following the standard protocol without intravenous contrast. Multiplanar CT image reconstructions of the cervical spine were also generated. COMPARISON:  CT head 12/08/2017 FINDINGS: CT HEAD FINDINGS Brain: Normal ventricular morphology. No midline shift or mass effect. Normal appearance of brain parenchyma. No intracranial hemorrhage, mass lesion, or evidence of acute infarction. No extra-axial fluid collections. Vascular: Normal appearance Skull: Intact Sinuses/Orbits: Clear Other: N/A CT CERVICAL SPINE FINDINGS Alignment: Normal Skull base and vertebrae: Vertebral mineralization normal. Skull base intact. Vertebral body heights maintained. Disc space narrowing and endplate spur formation at T1-T2. No fracture, subluxation, or bone destruction. Soft tissues and spinal canal: Prevertebral soft tissues normal thickness. Visualized cervical soft tissues otherwise normal appearance. Disc levels:  No additional abnormalities Upper chest: Lung apices clear Other: N/A IMPRESSION: Normal CT head. Degenerative disc disease changes at T1-T2.  Otherwise normal CT cervical spine. Electronically Signed   By: Ulyses Southward M.D.   On: 03/07/2018 15:50   Ct Cervical Spine Wo Contrast  Result Date: 03/07/2018 CLINICAL DATA:  Head trauma, at passed out going up 14 steps and struck her head Wednesday night at 0130 hours, aching all over, history diabetes mellitus, bruising to LEFT side of forehead EXAM: CT HEAD WITHOUT CONTRAST CT CERVICAL SPINE WITHOUT CONTRAST TECHNIQUE: Multidetector CT imaging of the head and cervical spine was performed following the standard protocol without intravenous contrast. Multiplanar CT image reconstructions of the cervical spine were also generated. COMPARISON:  CT head 12/08/2017 FINDINGS: CT HEAD FINDINGS Brain: Normal ventricular morphology. No midline shift or mass effect. Normal appearance of brain parenchyma. No intracranial hemorrhage, mass lesion, or evidence of acute infarction. No extra-axial fluid collections. Vascular: Normal appearance Skull: Intact Sinuses/Orbits: Clear Other: N/A CT CERVICAL SPINE FINDINGS Alignment: Normal Skull base and vertebrae: Vertebral mineralization normal. Skull base intact. Vertebral body heights maintained. Disc space narrowing and endplate spur formation at T1-T2. No fracture, subluxation, or bone destruction. Soft tissues and spinal canal: Prevertebral soft tissues normal thickness. Visualized cervical soft tissues otherwise normal appearance. Disc levels:  No additional abnormalities Upper chest: Lung apices clear Other: N/A IMPRESSION: Normal CT head. Degenerative disc disease changes at T1-T2. Otherwise normal CT cervical spine. Electronically Signed   By: Ulyses Southward M.D.   On: 03/07/2018 15:50   Dg Foot Complete Right  Result Date: 03/07/2018 CLINICAL DATA:  Right foot pain after fall down stairs. EXAM: RIGHT FOOT COMPLETE - 3+ VIEW COMPARISON:  None. FINDINGS: There is no evidence of fracture or dislocation. There is no evidence of arthropathy or other focal bone abnormality.  Soft tissues are unremarkable. IMPRESSION: Negative. Electronically Signed   By: Lupita Raider, M.D.   On: 03/07/2018 16:30   Dg Hip Unilat W Or Wo Pelvis 2-3 Views Right  Result Date: 03/07/2018 CLINICAL DATA:  Right hip pain after fall down stairs. EXAM: DG HIP (WITH OR WITHOUT PELVIS) 2-3V RIGHT COMPARISON:  None. FINDINGS: There is no evidence of hip fracture or dislocation. There is no evidence of arthropathy or other focal bone abnormality. IMPRESSION: Negative. Electronically Signed   By: Lupita Raider, M.D.   On: 03/07/2018  16:29    ____________________________________________   PROCEDURES  Procedure(s) performed:   Procedures  Critical Care performed:   ____________________________________________   INITIAL IMPRESSION / ASSESSMENT AND PLAN / ED COURSE     Patient studies are negative.  She does not have any dysuria.  She does not have any WATS white cells in her urine but she does have many bacteria.  This could be asymptomatic bacteriuria.  I will not treated at this time.  I will let her go home and follow-up with her regular doctor to continue working on the syncope.  Treat her for contusions and return as needed.       ____________________________________________   FINAL CLINICAL IMPRESSION(S) / ED DIAGNOSES  Final diagnoses:  Syncope and collapse  Multiple contusions     ED Discharge Orders    None       Note:  This document was prepared using Dragon voice recognition software and may include unintentional dictation errors.    Arnaldo Natal, MD 03/07/18 [id number]

## 2018-03-07 NOTE — ED Triage Notes (Signed)
Pt states she passed out going up 14 steps and hit her head Wednesday night around 130am. Pt c/o aching all over . Denies any numbness or tingling. Denies any vision changes or N/V.Marland Kitchen pt states she passed out and fell about 2 weeks ago. Pt does have bruising to the left side forehead. Pt ambulatory on arrival. Pt has low O2 in triage 88-91%. Denies home )2 just cpap at night

## 2018-03-09 ENCOUNTER — Ambulatory Visit
Admission: RE | Admit: 2018-03-09 | Discharge: 2018-03-09 | Disposition: A | Discharge status: Home / Self Care | Payer: Medicare HMO | Source: Ambulatory Visit | Attending: Neurology | Admitting: Neurology

## 2018-03-09 DIAGNOSIS — S0990XA Unspecified injury of head, initial encounter: Secondary | ICD-10-CM | POA: Diagnosis not present

## 2018-03-09 DIAGNOSIS — G43719 Chronic migraine without aura, intractable, without status migrainosus: Secondary | ICD-10-CM | POA: Insufficient documentation

## 2018-03-09 DIAGNOSIS — G43919 Migraine, unspecified, intractable, without status migrainosus: Secondary | ICD-10-CM | POA: Diagnosis not present

## 2018-03-13 ENCOUNTER — Other Ambulatory Visit: Payer: Self-pay | Admitting: Psychiatry

## 2018-03-13 ENCOUNTER — Ambulatory Visit: Payer: Medicare HMO | Admitting: Licensed Clinical Social Worker

## 2018-03-13 DIAGNOSIS — F4010 Social phobia, unspecified: Secondary | ICD-10-CM

## 2018-03-13 DIAGNOSIS — F411 Generalized anxiety disorder: Secondary | ICD-10-CM

## 2018-03-13 MED ORDER — PROPRANOLOL HCL 10 MG PO TABS: 10.0000 mg | ORAL_TABLET | Freq: Three times a day (TID) | ORAL | 1 refills | Status: DC | PRN

## 2018-03-13 NOTE — Telephone Encounter (Signed)
Sent propranolol to pharmacy 

## 2018-03-18 ENCOUNTER — Telehealth: Payer: Self-pay | Admitting: Student in an Organized Health Care Education/Training Program

## 2018-03-18 NOTE — Telephone Encounter (Signed)
Pharmacist called stating that pts Lyrica capsules need a new PA they think it is because the pt is requesting brand name instead of generic that could be why the insurance is requiring a PA every time she needs a refill

## 2018-03-19 DIAGNOSIS — Z794 Long term (current) use of insulin: Secondary | ICD-10-CM | POA: Diagnosis not present

## 2018-03-19 DIAGNOSIS — E119 Type 2 diabetes mellitus without complications: Secondary | ICD-10-CM | POA: Diagnosis not present

## 2018-03-19 DIAGNOSIS — E781 Pure hyperglyceridemia: Secondary | ICD-10-CM | POA: Diagnosis not present

## 2018-03-20 ENCOUNTER — Other Ambulatory Visit: Payer: Self-pay | Admitting: Psychiatry

## 2018-03-22 DIAGNOSIS — G4733 Obstructive sleep apnea (adult) (pediatric): Secondary | ICD-10-CM | POA: Diagnosis not present

## 2018-04-02 ENCOUNTER — Telehealth: Payer: Self-pay

## 2018-04-02 NOTE — Telephone Encounter (Signed)
pt needs enought medication of the lamictal to get to her appt

## 2018-04-03 NOTE — Telephone Encounter (Signed)
Patient states she wants prozac and not lamictal, discussed to call pharmacy since she was given refills .

## 2018-04-07 ENCOUNTER — Telehealth: Payer: Self-pay

## 2018-04-07 NOTE — Telephone Encounter (Signed)
pt called states she is out of medication needs refills

## 2018-04-08 ENCOUNTER — Other Ambulatory Visit: Payer: Self-pay

## 2018-04-08 ENCOUNTER — Encounter: Payer: Self-pay | Admitting: Psychiatry

## 2018-04-08 ENCOUNTER — Ambulatory Visit (INDEPENDENT_AMBULATORY_CARE_PROVIDER_SITE_OTHER): Payer: Medicare HMO | Admitting: Psychiatry

## 2018-04-08 VITALS — BP 134/80 | HR 101 | Temp 98.0°F | Wt 223.2 lb

## 2018-04-08 DIAGNOSIS — F4010 Social phobia, unspecified: Secondary | ICD-10-CM

## 2018-04-08 DIAGNOSIS — F331 Major depressive disorder, recurrent, moderate: Secondary | ICD-10-CM | POA: Diagnosis not present

## 2018-04-08 DIAGNOSIS — F411 Generalized anxiety disorder: Secondary | ICD-10-CM

## 2018-04-08 DIAGNOSIS — F5105 Insomnia due to other mental disorder: Secondary | ICD-10-CM | POA: Diagnosis not present

## 2018-04-08 MED ORDER — LAMOTRIGINE 25 MG PO TABS: 50.0000 mg | ORAL_TABLET | Freq: Every day | ORAL | 1 refills | Status: DC

## 2018-04-08 NOTE — Telephone Encounter (Signed)
done

## 2018-04-08 NOTE — Progress Notes (Signed)
BH MD OP Progress Note  04/08/2018 5:29 PM Meredith Williams  MRN:  [id number]  Chief Complaint: ' I am here for follow up.' Chief Complaint    Follow-up; Medication Refill     HPI: Meredith Williams is a 38 year old Caucasian female, single, lives in [place], on [place], presented to clinic today for a follow-up visit.  Patient has a history of depression, anxiety, insomnia, myotonic dystrophy, diabetes melitis and migraine headaches,  Patient today reports she is doing well on the current medication regimen.  She reports her mood symptoms are stable.  She continues to take the Lamictal.  She reports the Lamictal makes her drowsy and hence she takes it at bedtime.  She is tolerating it well.  Patient reports she continues to be compliant with her Prozac however she reports she may have gotten a low quantity of medication from the pharmacy this time.  Called the pharmacist while patient was in session today.  Patient was included in the call.  Per pharmacist at [facility]- patient was given 180 capsules of Prozac on January 13 and she is not due for a refill yet.  However pharmacist advised patient to bring in the bottle that she had picked up to clarify this and they will help her out.'  Patient continues to be in psychotherapy sessions with Ms. Kerby Nora which is going well.  Patient reports she recently had some falls and she had several imaging done by her other provider and was advised that it may be due to some combination of medication she is on.  She reports she has quit taking the Lyrica and muscle relaxant together and that has helped.  Patient denies any other concerns today. Visit Diagnosis:    ICD-10-CM   1. MDD (major depressive disorder), recurrent episode, moderate (HCC) F33.1 lamoTRIgine (LAMICTAL) 25 MG tablet  2. GAD (generalized anxiety disorder) F41.1 lamoTRIgine (LAMICTAL) 25 MG tablet  3. Social anxiety disorder F40.10   4. Insomnia due to mental disorder F51.05     Past  Psychiatric History: I have reviewed past psychiatric history from my progress note on 04/16/2017.  Past trials of Cymbalta-ineffective, Viibryd, BuSpar, Effexor.  Past Medical History:  Past Medical History:  Diagnosis Date  . Anxiety   . Depression   . Diabetes mellitus without complication (HCC)   . Hyperlipidemia   . Muscular dystrophy, myotonic (HCC)   . Pancreatitis     Past Surgical History:  Procedure Laterality Date  . ABDOMINAL HYSTERECTOMY    . HIP SURGERY Right 2016   "removal of a bone growth"    Family Psychiatric History: I have reviewed family psychiatric history from my progress note on 04/16/2017.  Family History:  Family History  Problem Relation Age of Onset  . Stroke Mother   . Muscular dystrophy Mother   . Drug abuse Brother   . Depression Sister   . Anxiety disorder Sister   . Muscular dystrophy Sister   . Depression Sister     Social History: I have reviewed social history from my progress note on 04/16/2017. Social History   Socioeconomic History  . Marital status: Single    Spouse name: Not on file  . Number of children: 0  . Years of education: Not on file  . Highest education level: Some college, no degree  Occupational History    Comment: disability  Social Needs  . Financial resource strain: Not hard at all  . Food insecurity:    Worry: Never true  Inability: Never true  . Transportation needs:    Medical: Yes    Non-medical: Yes  Tobacco Use  . Smoking status: Passive Smoke Exposure - Never Smoker  . Smokeless tobacco: Never Used  Substance and Sexual Activity  . Alcohol use: No    Alcohol/week: 0.0 standard drinks    Frequency: Never  . Drug use: No  . Sexual activity: Yes    Birth control/protection: Pill  Lifestyle  . Physical activity:    Days per week: Not on file    Minutes per session: Not on file  . Stress: Very much  Relationships  . Social connections:    Talks on phone: More than three times a week    Gets  together: More than three times a week    Attends religious service: Never    Active member of club or organization: No    Attends meetings of clubs or organizations: Never    Relationship status: Never married  Other Topics Concern  . Not on file  Social History Narrative   Patient went to the ED d/t pain in her side.  Every time she eats, she has increased pain.  Ob/gyn reported after CT that she had an ovarian cyst.  She will be going ob/gyn today for an injection to help with this.     Allergies:  Allergies  Allergen Reactions  . Other Other (See Comments)    Patient states that she is allergic to any medication that is in a patch form as it will "go into her muscle."    Metabolic Disorder Labs: No results found for: HGBA1C, MPG No results found for: PROLACTIN Lab Results  Component Value Date   CHOL 168 06/15/2016   TRIG 285 (H) 06/15/2016   HDL 44 06/15/2016   CHOLHDL 3.8 06/15/2016   VLDL 57 (H) 06/15/2016   LDLCALC 67 06/15/2016   Lab Results  Component Value Date   TSH 0.900 03/07/2018    Therapeutic Level Labs: No results found for: LITHIUM No results found for: VALPROATE No components found for:  CBMZ  Current Medications: Current Outpatient Medications  Medication Sig Dispense Refill  . aspirin EC 81 MG tablet Take 81 mg by mouth daily.    . cyclobenzaprine (FLEXERIL) 10 MG tablet Take 15 mg twice a day    . fenofibrate 160 MG tablet Take 160 mg by mouth daily.  0  . FLUoxetine (PROZAC) 40 MG capsule Take 2 capsules (80 mg total) by mouth daily. 180 capsule 1  . fluticasone (FLONASE) 50 MCG/ACT nasal spray Place into the nose.    . furosemide (LASIX) 20 MG tablet TAKE 1 TABLET(20 MG) BY MOUTH EVERY DAY AS NEEDED FOR SWELLING    . JUNEL FE 24 1-20 MG-MCG(24) tablet Take 1 tablet by mouth daily.  0  . lamoTRIgine (LAMICTAL) 25 MG tablet Take 2 tablets (50 mg total) by mouth daily. 180 tablet 1  . metFORMIN (GLUCOPHAGE-XR) 500 MG 24 hr tablet Take by mouth.     . niacin (NIASPAN) 500 MG CR tablet Take by mouth.    . norgestrel-ethinyl estradiol (OGESTREL) 0.5-50 MG-MCG tablet Take by mouth.    . nortriptyline (PAMELOR) 50 MG capsule Take by mouth.    . pregabalin (LYRICA) 100 MG capsule 100 mg in the qAM, 200 mg qPM 90 capsule 5  . propranolol (INDERAL) 10 MG tablet Take 1 tablet (10 mg total) by mouth 3 (three) times daily as needed. 270 tablet 1  . TRESIBA FLEXTOUCH  100 UNIT/ML SOPN FlexTouch Pen INJECT 50 UNITS SUBCUTANEOUSLY ONCE DAILY  11  . triamcinolone cream (KENALOG) 0.5 % Apply topically.    Marland Kitchen. zolpidem (AMBIEN) 10 MG tablet TAKE 1 TABLET(10 MG) BY MOUTH AT BEDTIME AS NEEDED FOR SLEEP 30 tablet 2   No current facility-administered medications for this visit.      Musculoskeletal: Strength & Muscle Tone: within normal limits Gait & Station: walks with cane Patient leans: N/A  Psychiatric Specialty Exam: Review of Systems  Psychiatric/Behavioral: The patient is not nervous/anxious.   All other systems reviewed and are negative.   Blood pressure 134/80, pulse (!) 101, temperature 98 F (36.7 C), temperature source Oral, weight 223 lb 3.2 oz (101.2 kg).Body mass index is 34.96 kg/m.  General Appearance: Casual  Eye Contact:  Fair  Speech:  Normal Rate  Volume:  Normal  Mood:  Euthymic  Affect:  Congruent  Thought Process:  Goal Directed and Descriptions of Associations: Intact  Orientation:  Full (Time, Place, and Person)  Thought Content: Logical   Suicidal Thoughts:  No  Homicidal Thoughts:  No  Memory:  Immediate;   Fair Recent;   Fair Remote;   Fair  Judgement:  Fair  Insight:  Fair  Psychomotor Activity:  Normal  Concentration:  Concentration: Fair and Attention Span: Fair  Recall:  [employer]Fair  Fund of Knowledge: Fair  Language: Fair  Akathisia:  No  Handed:  Right  AIMS (if indicated): denies tremors, rigidity  Assets:  Communication Skills Desire for Improvement Social Support  ADL's:  Intact  Cognition: WNL   Sleep:  Fair   Screenings: PHQ2-9     [facility] Office Visit from 09/18/2017 [facility] Clinical Support from 08/06/2017 in [facility] Clinical Support from 04/11/2017 in [facility] Clinical [facility]  PHQ-2 Total Score  3  3  0  0  0  PHQ-9 Total Score  12  11  -  -  -       Assessment and Plan: Meredith Williams is a 38 year old Caucasian female who has a history of depression, anxiety, chronic pain, myotonic dystrophy, diabetes, migraine headaches, presented to clinic today for a follow-up visit.  Patient continues to do well on the current medication regimen.  We will continue medications and plan as noted below.  Plan MDD-improving Prozac 80 mg p.o. daily. Continue Pamelor prescribed per neurology. Lamictal 50 mg p.o. daily.  For anxiety disorder-improving Prozac as prescribed. Propanolol 10 mg p.o. 3 times daily as needed  For insomnia--improving Ambien 10 mg p.o. nightly Pamelor also helps. Continue CPAP.  For bereavement-improving She will continue psychotherapy sessions.  [facility] and discussed patient's concerns about her Prozac.  Per pharmacist patient advised to return to pharmacy with her previous bottle of Prozac and he will help her out.  Patient to follow-up in clinic in 2 months or sooner if needed.  I have spent atleast 15 minutes  face to face with patient today. More than 50 % of the time was spent for psychoeducation and supportive psychotherapy and care coordination.  This note was generated in part or whole with voice recognition software. Voice recognition is usually quite accurate but there are transcription errors that can and very often do occur. I apologize  for any typographical errors that were  not detected and corrected.         Jomarie Longs, MD 04/08/2018, 5:29 PM

## 2018-04-16 ENCOUNTER — Other Ambulatory Visit: Payer: Self-pay | Admitting: Psychiatry

## 2018-04-16 DIAGNOSIS — G4701 Insomnia due to medical condition: Secondary | ICD-10-CM

## 2018-04-16 NOTE — Progress Notes (Signed)
   THERAPIST PROGRESS NOTE  Session Time: 59mn  Participation Level: Active  Behavioral Response: CasualAlertDepressed  Type of Therapy: Individual Therapy  Treatment Goals addressed: Anxiety  Interventions: CBT and Motivational Interviewing  Summary: Meredith MARTELLOis a 38y.o. female who presents with continued symptoms of her diagnosis.  Therapist met with Patient in an outpatient setting to assess current mood and assist with making progress towards goals through the use of therapeutic intervention. Therapist did a brief mood check, assessing anger, fear, disgust, excitement, happiness, and sadness.  Patient reports "sadness" due to her mother's failing health and the death of her sister.  She reprots that she has spent more time in the bed.  Discussion of the stages of death and explored coping strategies.  Discussion of social support.    Suicidal/Homicidal: No  Plan: Return again in2 weeks.  Diagnosis: Axis I: Depressive Disorder NOS and Generalized Anxiety Disorder    Axis II: No diagnosis    NLubertha South LCSW 02/20/2018

## 2018-04-16 NOTE — Telephone Encounter (Signed)
Sent Ambien to pharmacy 

## 2018-04-17 DIAGNOSIS — G4733 Obstructive sleep apnea (adult) (pediatric): Secondary | ICD-10-CM | POA: Diagnosis not present

## 2018-04-20 DIAGNOSIS — G4733 Obstructive sleep apnea (adult) (pediatric): Secondary | ICD-10-CM | POA: Diagnosis not present

## 2018-04-30 ENCOUNTER — Ambulatory Visit (INDEPENDENT_AMBULATORY_CARE_PROVIDER_SITE_OTHER): Payer: Medicare HMO | Admitting: Licensed Clinical Social Worker

## 2018-04-30 ENCOUNTER — Other Ambulatory Visit: Payer: Self-pay

## 2018-04-30 DIAGNOSIS — F331 Major depressive disorder, recurrent, moderate: Secondary | ICD-10-CM | POA: Diagnosis not present

## 2018-04-30 DIAGNOSIS — F411 Generalized anxiety disorder: Secondary | ICD-10-CM | POA: Diagnosis not present

## 2018-05-26 ENCOUNTER — Ambulatory Visit: Payer: Medicare HMO | Admitting: Licensed Clinical Social Worker

## 2018-05-26 ENCOUNTER — Other Ambulatory Visit: Payer: Self-pay

## 2018-06-07 NOTE — Progress Notes (Signed)
   THERAPIST PROGRESS NOTE  Session Time:  Participation Level: Active  Behavioral Response: CasualAlertDepressed  Type of Therapy: Individual Therapy  Treatment Goals addressed: Anxiety  Interventions: CBT and Motivational Interviewing  Summary: Meredith Williams is a 38 y.o. female who presents with continued symptoms of her diagnosis.  Therapist explored emotions and patterns of behavior/symptoms.  Discussed how to reduce symptoms during pandemic.    Suicidal/Homicidal: No  Plan: Return again in2 weeks.  Diagnosis: Axis I: Depressive Disorder NOS and Generalized Anxiety Disorder    Axis II: No diagnosis    Marinda Elk, LCSW 04/30/2018

## 2018-06-09 ENCOUNTER — Ambulatory Visit: Payer: Medicare HMO | Admitting: Psychiatry

## 2018-06-10 ENCOUNTER — Other Ambulatory Visit: Payer: Self-pay

## 2018-06-10 ENCOUNTER — Ambulatory Visit (INDEPENDENT_AMBULATORY_CARE_PROVIDER_SITE_OTHER): Payer: Medicare HMO | Admitting: Psychiatry

## 2018-06-10 ENCOUNTER — Encounter: Payer: Self-pay | Admitting: Psychiatry

## 2018-06-10 DIAGNOSIS — F4010 Social phobia, unspecified: Secondary | ICD-10-CM

## 2018-06-10 DIAGNOSIS — F331 Major depressive disorder, recurrent, moderate: Secondary | ICD-10-CM

## 2018-06-10 DIAGNOSIS — F5105 Insomnia due to other mental disorder: Secondary | ICD-10-CM | POA: Diagnosis not present

## 2018-06-10 DIAGNOSIS — F411 Generalized anxiety disorder: Secondary | ICD-10-CM | POA: Diagnosis not present

## 2018-06-10 MED ORDER — FLUOXETINE HCL 40 MG PO CAPS: 40.0000 mg | ORAL_CAPSULE | Freq: Every day | ORAL | 0 refills | Status: DC

## 2018-06-10 NOTE — Progress Notes (Signed)
Virtual Visit via Video Note  I connected with Meredith Williams on 06/10/18 at  2:45 PM EDT by a video enabled telemedicine application and verified that I am speaking with the correct person using two identifiers.   I discussed the limitations of evaluation and management by telemedicine and the availability of in person appointments. The patient expressed understanding and agreed to proceed.    I discussed the assessment and treatment plan with the patient. The patient was provided an opportunity to ask questions and all were answered. The patient agreed with the plan and demonstrated an understanding of the instructions.   The patient was advised to call back or seek an in-person evaluation if the symptoms worsen or if the condition fails to improve as anticipated.   BH MD OP Progress Note  06/10/2018 4:11 PM Meredith Williams  MRN:  [id number]  Chief Complaint:  Chief Complaint    Follow-up     HPI: Meredith Williams is a 38 year old Caucasian female, single, lives in [place], on [place], has a history of depression, insomnia, myotonic dystrophy, diabetes melitis, migraine headaches was evaluated by telemedicine today.  Patient reports she is currently coping okay with the COVID-19 outbreak.  She reports she has a lot of family support system which is working out well.  She reports she continues to struggle with some sadness, restlessness, worrying. She reports she had stopped taking her Prozac and her lamotrigine 3 weeks ago since she felt she was choking upon all her medications and was on a lot.  She however reports today she would like to restart her medications again.  Discussed with patient that we can restart at the lower dosage.  Discussed restarting Prozac at 40 mg.  Discussed with patient to restart lamotrigine at 25 mg for 2 weeks and then increase it to 50 mg after that.  Patient agreeable.  Patient to continue psychotherapy sessions with Ms. Peacock.  Patient denies any suicidality,  homicidality or perceptual disturbances.  She reports sleep was okay so far.  She continues to have Ambien as needed when she needs it.  She denies any other concerns today. Visit Diagnosis:    ICD-10-CM   1. MDD (major depressive disorder), recurrent episode, moderate (HCC) F33.1 FLUoxetine (PROZAC) 40 MG capsule  2. GAD (generalized anxiety disorder) F41.1 FLUoxetine (PROZAC) 40 MG capsule  3. Social anxiety disorder F40.10   4. Insomnia due to mental disorder F51.05     Past Psychiatric History: I have reviewed past psychiatric history from my progress note on 04/16/2017.  Past trials of Cymbalta-ineffective, Viibryd, BuSpar, Effexor  Past Medical History:  Past Medical History:  Diagnosis Date  . Anxiety   . Depression   . Diabetes mellitus without complication (HCC)   . Hyperlipidemia   . Muscular dystrophy, myotonic (HCC)   . Pancreatitis     Past Surgical History:  Procedure Laterality Date  . ABDOMINAL HYSTERECTOMY    . HIP SURGERY Right 2016   "removal of a bone growth"    Family Psychiatric History: I have reviewed family psychiatric history from my progress note on 04/16/2017.  Family History:  Family History  Problem Relation Age of Onset  . Stroke Mother   . Muscular dystrophy Mother   . Drug abuse Brother   . Depression Sister   . Anxiety disorder Sister   . Muscular dystrophy Sister   . Depression Sister     Social History: Reviewed social history from my progress note on 04/16/2017. Social History  Socioeconomic History  . Marital status: Single    Spouse name: Not on file  . Number of children: 0  . Years of education: Not on file  . Highest education level: Some college, no degree  Occupational History    Comment: disability  Social Needs  . Financial resource strain: Not hard at all  . Food insecurity:    Worry: Never true    Inability: Never true  . Transportation needs:    Medical: Yes    Non-medical: Yes  Tobacco Use  . Smoking  status: Passive Smoke Exposure - Never Smoker  . Smokeless tobacco: Never Used  Substance and Sexual Activity  . Alcohol use: No    Alcohol/week: 0.0 standard drinks    Frequency: Never  . Drug use: No  . Sexual activity: Yes    Birth control/protection: Pill  Lifestyle  . Physical activity:    Days per week: Not on file    Minutes per session: Not on file  . Stress: Very much  Relationships  . Social connections:    Talks on phone: More than three times a week    Gets together: More than three times a week    Attends religious service: Never    Active member of club or organization: No    Attends meetings of clubs or organizations: Never    Relationship status: Never married  Other Topics Concern  . Not on file  Social History Narrative   Patient went to the ED d/t pain in her side.  Every time she eats, she has increased pain.  Ob/gyn reported after CT that she had an ovarian cyst.  She will be going ob/gyn today for an injection to help with this.     Allergies:  Allergies  Allergen Reactions  . Other Other (See Comments)    Patient states that she is allergic to any medication that is in a patch form as it will "go into her muscle."    Metabolic Disorder Labs: No results found for: HGBA1C, MPG No results found for: PROLACTIN Lab Results  Component Value Date   CHOL 168 06/15/2016   TRIG 285 (H) 06/15/2016   HDL 44 06/15/2016   CHOLHDL 3.8 06/15/2016   VLDL 57 (H) 06/15/2016   LDLCALC 67 06/15/2016   Lab Results  Component Value Date   TSH 0.900 03/07/2018    Therapeutic Level Labs: No results found for: LITHIUM No results found for: VALPROATE No components found for:  CBMZ  Current Medications: Current Outpatient Medications  Medication Sig Dispense Refill  . aspirin EC 81 MG tablet Take 81 mg by mouth daily.    . cyclobenzaprine (FLEXERIL) 10 MG tablet Take 15 mg twice a day    . fenofibrate 160 MG tablet Take 160 mg by mouth daily.  0  . FLUoxetine  (PROZAC) 40 MG capsule Take 1 capsule (40 mg total) by mouth daily. 90 capsule 0  . fluticasone (FLONASE) 50 MCG/ACT nasal spray Place into the nose.    . furosemide (LASIX) 20 MG tablet TAKE 1 TABLET(20 MG) BY MOUTH EVERY DAY AS NEEDED FOR SWELLING    . JUNEL FE 24 1-20 MG-MCG(24) tablet Take 1 tablet by mouth daily.  0  . lamoTRIgine (LAMICTAL) 25 MG tablet Take 2 tablets (50 mg total) by mouth daily. 180 tablet 1  . metFORMIN (GLUCOPHAGE-XR) 500 MG 24 hr tablet Take by mouth.    . niacin (NIASPAN) 500 MG CR tablet Take by mouth.    Marland Kitchen  norgestrel-ethinyl estradiol (OGESTREL) 0.5-50 MG-MCG tablet Take by mouth.    . nortriptyline (PAMELOR) 50 MG capsule Take by mouth.    . pregabalin (LYRICA) 100 MG capsule 100 mg in the qAM, 200 mg qPM 90 capsule 5  . propranolol (INDERAL) 10 MG tablet Take 1 tablet (10 mg total) by mouth 3 (three) times daily as needed. 270 tablet 1  . TRESIBA FLEXTOUCH 100 UNIT/ML SOPN FlexTouch Pen INJECT 50 UNITS SUBCUTANEOUSLY ONCE DAILY  11  . triamcinolone cream (KENALOG) 0.5 % Apply topically.    Marland Kitchen. zolpidem (AMBIEN) 10 MG tablet TAKE 1 TABLET(10 MG) BY MOUTH AT BEDTIME AS NEEDED FOR SLEEP 30 tablet 2   No current facility-administered medications for this visit.      Musculoskeletal: Strength & Muscle Tone: within normal limits Gait & Station: unsteady chronic Patient leans: N/A  Psychiatric Specialty Exam: Review of Systems  Psychiatric/Behavioral: Positive for depression. The patient is nervous/anxious.   All other systems reviewed and are negative.   There were no vitals taken for this visit.There is no height or weight on file to calculate BMI.  General Appearance: Casual  Eye Contact:  Fair  Speech:  Clear and Coherent  Volume:  Normal  Mood:  Anxious and Depressed  Affect:  Congruent  Thought Process:  Goal Directed and Descriptions of Associations: Intact  Orientation:  Full (Time, Place, and Person)  Thought Content: Logical   Suicidal  Thoughts:  No  Homicidal Thoughts:  No  Memory:  Immediate;   Fair Recent;   Fair Remote;   Fair  Judgement:  Fair  Insight:  Fair  Psychomotor Activity:  Normal  Concentration:  Concentration: Fair and Attention Span: Fair  Recall:  [employer]Fair  Fund of Knowledge: Fair  Language: Fair  Akathisia:  No  Handed:  Right  AIMS (if indicated): Denies tremors, rigidity, stiffness  Assets:  Communication Skills Desire for Improvement Housing Social Support  ADL's:  Intact  Cognition: WNL  Sleep:  Fair   Screenings: PHQ2-9     Clinical Support from 02/18/2018 [facility] Office Visit from [facility]/18/[facility] Clinical Support from 08/06/2017 in [facility] Clinical Support from [facility]/11/2017 [facility] Clinical Support from 12[facility]25/2016 [facility]  PHQ-2 Total Score  3  3  0  0  0  PHQ-9 Total Score  12  11  -  -  -       Assessment and Plan: Meredith DecSara is a 38 year old Caucasian female who has a history of MDD, anxiety, chronic pain, myotonic dystrophy, diabetes, migraine headaches was evaluated by telemedicine today.  Patient with noncompliance with her medications however reports would like to restart medications again today.  Discussed plan as noted below.  Plan MDD- unstable Restart medications again. Prozac to be restarted at 40 mg p.o. daily Continue Pamelor prescribed per neurology. Start Lamictal 25 mg p.o. daily for 2 weeks and increase it to 50 mg.  For anxiety disorder- some improvement Prozac as prescribed Propranolol 10 mg p.o. 3 times daily as needed  For insomnia-improving Ambien 10 mg p.o. nightly Pamelor also helps Continue CPAP  For bereavement-improving She will continue psychotherapy sessions.  Follow up in clinic in 3 weeks or sooner if  needed.  Appointment scheduled for June 11 at 10:30 AM.  I have spent atleast 15 minutes non face to  face with patient today. More than 50 % of the time was spent for psychoeducation and supportive psychotherapy and care coordination.   This note was generated in part or whole with voice recognition software. Voice recognition is usually quite accurate but there are transcription errors that can and very often do occur. I apologize for any typographical errors that were not detected and corrected.         Jomarie Longs, MD 06/10/2018, 4:11 PM

## 2018-06-16 IMAGING — CT CT ABD-PELV W/ CM
1 of 2 series · 14 of 32 positions shown, 18 images · IV contrast (APPLIED)
Comparison: 06/23/2016

CLINICAL DATA: Severe mid abdominal pain, nausea and vomiting since
11/15/2016.

EXAM:
CT ABDOMEN AND PELVIS WITH CONTRAST
TECHNIQUE: Multidetector CT imaging of the abdomen and pelvis was performed
using the standard protocol following bolus administration of
intravenous contrast.
CONTRAST:  100 cc Isovue 370 IV

[Series 2: axial st · axial · 0.79mm/px · z∈[-979,-509]mm · 14 of 104 slices shown, 18 images]
[im 5/104  soft-tissue]
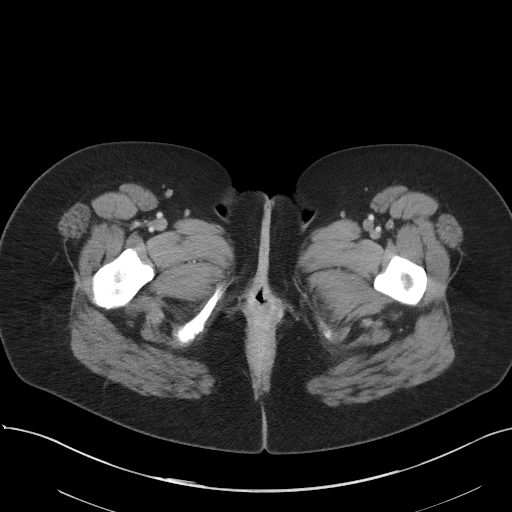
[im 5/104  bone]
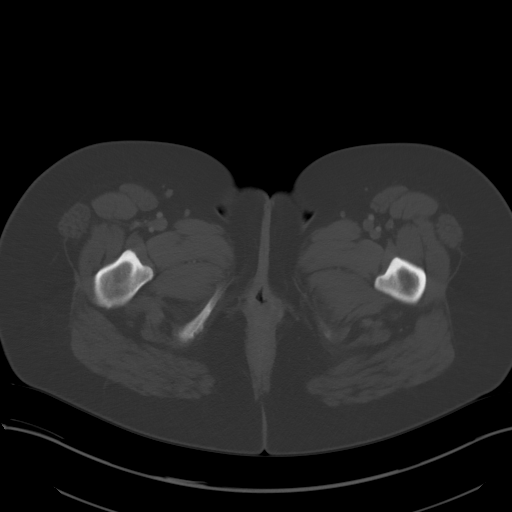
[im 14/104  soft-tissue]
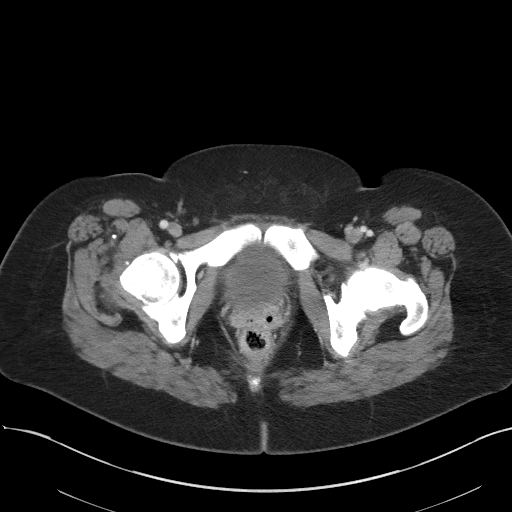
[im 23/104  soft-tissue]
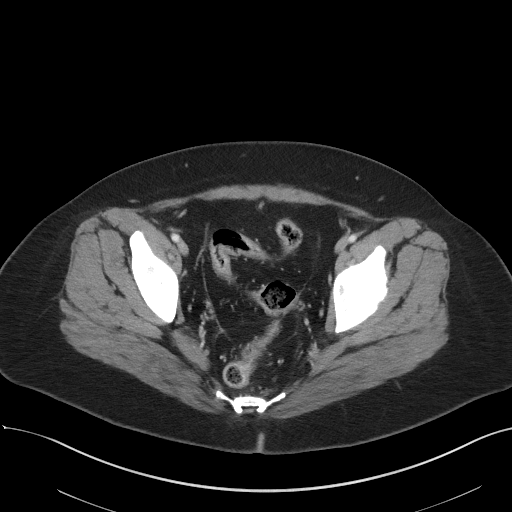
[im 32/104  soft-tissue]
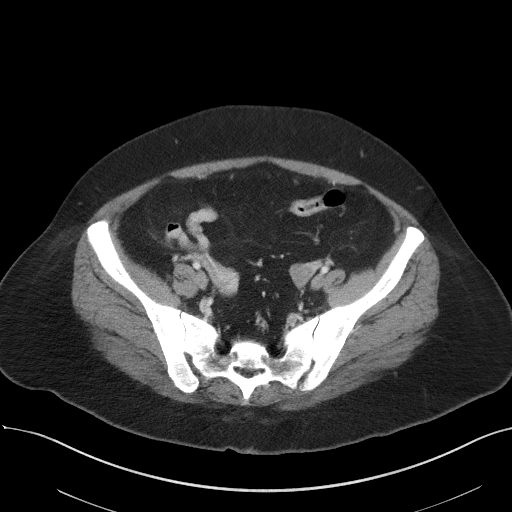
[im 41/104  soft-tissue]
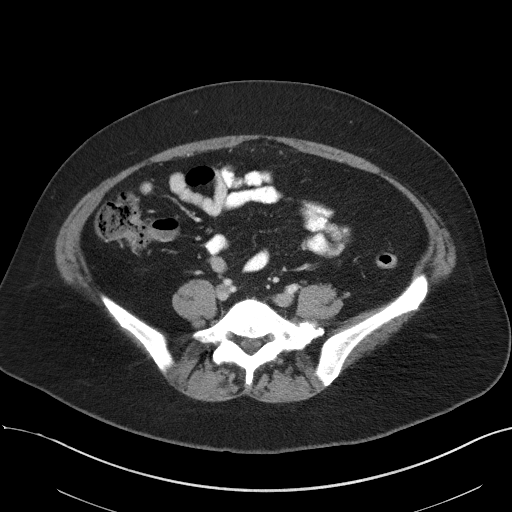
[im 50/104  soft-tissue]
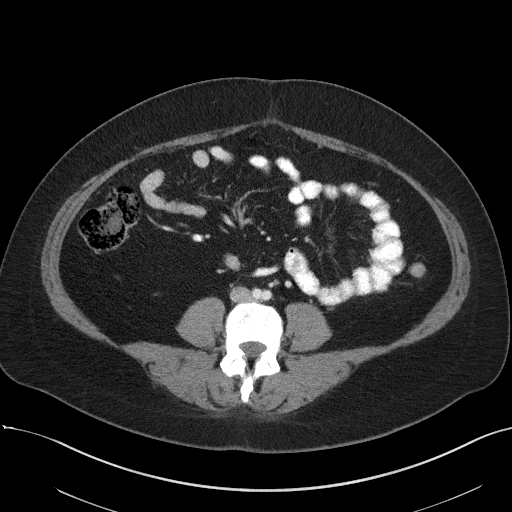
[im 54/104  soft-tissue]
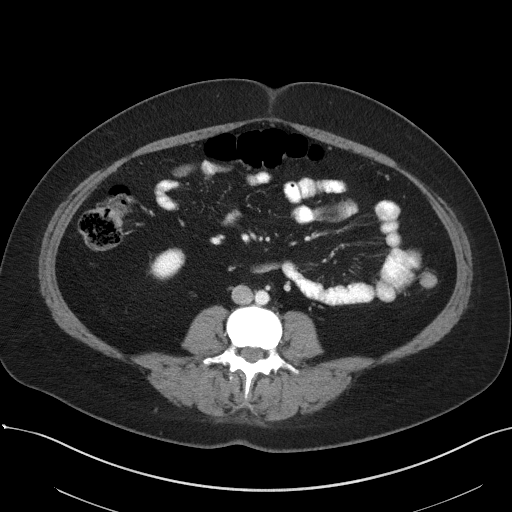
[im 63/104  soft-tissue]
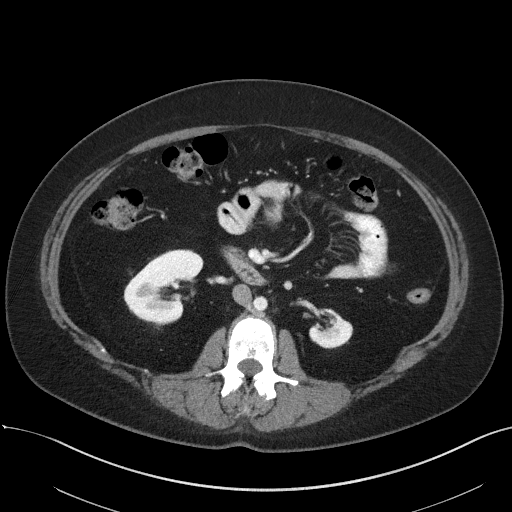
[im 72/104  soft-tissue]
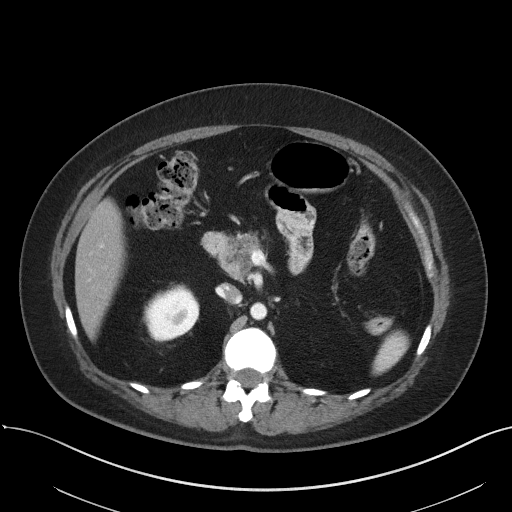
[im 72/104  bone]
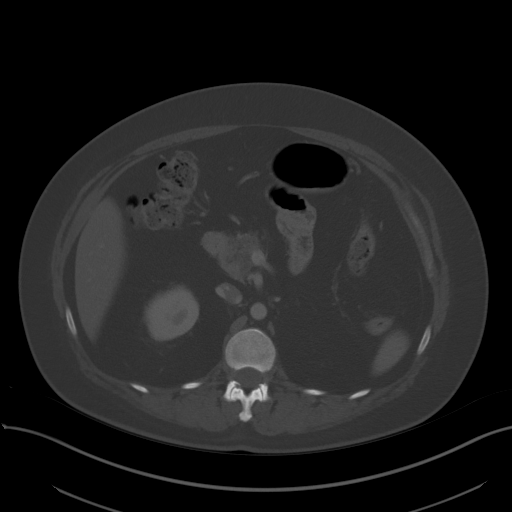
[im 81/104  soft-tissue]
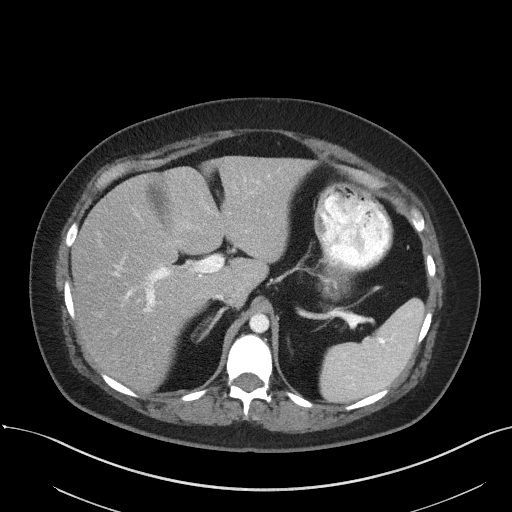
[im 86/104  lung]
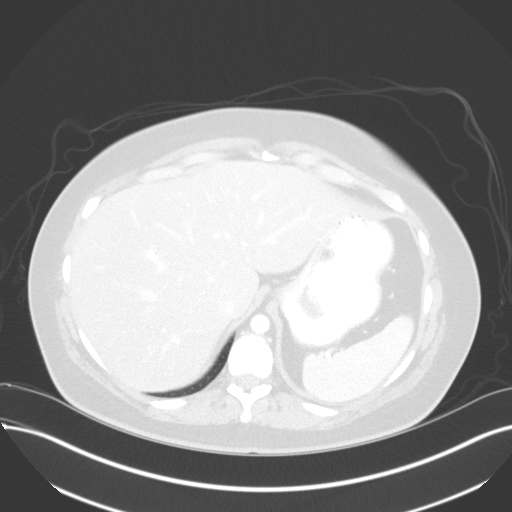
[im 90/104  soft-tissue]
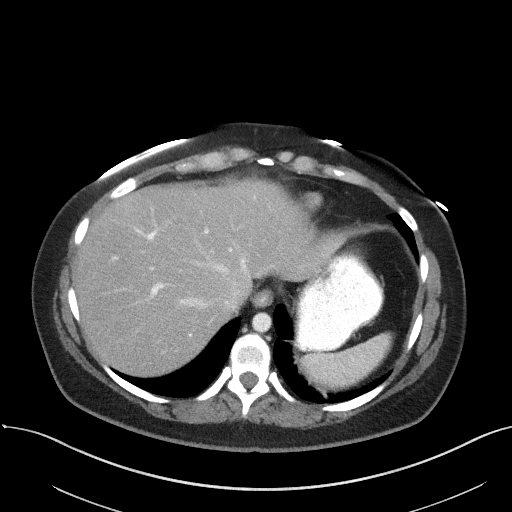
[im 90/104  lung]
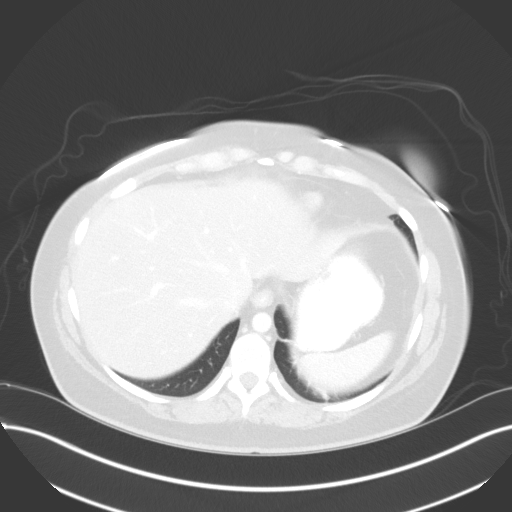
[im 95/104  lung]
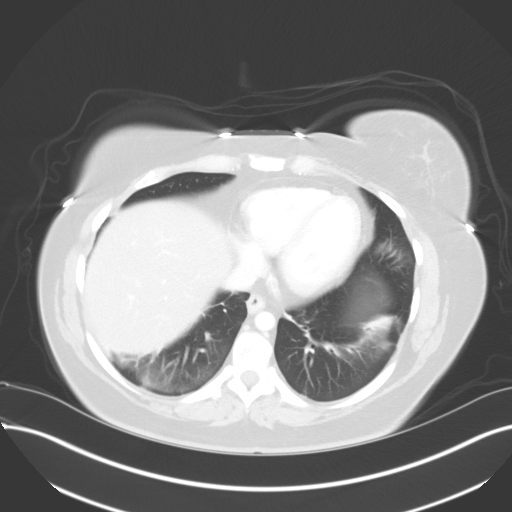
[im 99/104  soft-tissue]
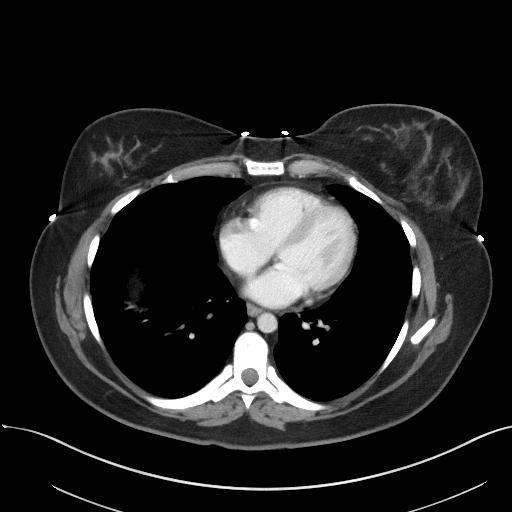
[im 99/104  lung]
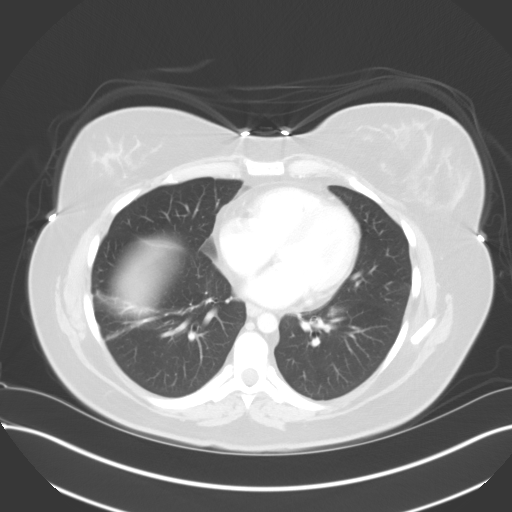

[14 of 32 positions shown; findings below may reference images not displayed]

FINDINGS: Lower chest: Normal size heart without pericardial effusion. Linear
platelike atelectasis and/or scarring within each lower lobe with
subsegmental atelectasis in the lingula and left lower lobe as well.
No dominant mass, pneumonic consolidation or effusion.

Hepatobiliary: Hypodense appearance of the liver consistent with
hepatic steatosis. No space-occupying mass. Normal gallbladder
without stones. No biliary dilatation.

Pancreas: Mild fatty infiltration of the pancreatic head and
uncinate process. No dominant mass or ductal dilatation. No
inflammation.

Spleen: Normal

Adrenals/Urinary Tract: Normal bilateral adrenal glands. Atrophic
left kidney with compensatory hypertrophy of the right. Retroaortic
left renal vein. No obstructive uropathy. Decompressed urinary
bladder without focal mural thickening or calculus.

Stomach/Bowel: Go physiologic distention of the stomach with enteric
contrast. There is normal small bowel rotation and ligament of
Treitz position. No small bowel obstruction or inflammation.
Normal-appearing appendix. An average amount of stool is noted
within the colon. No acute large bowel abnormality.

Vascular/Lymphatic: No aortic aneurysm or dissection. No
lymphadenopathy.

Reproductive: Status post hysterectomy. Peripherally enhancing 19 mm
ruptured cyst or follicle of the right ovary with associated small
amount of free fluid in the cul-de-sac.

Other: No abdominal wall hernia.

Musculoskeletal: No acute or significant osseous findings.
IMPRESSION: 1. Irregular peripherally enhancing 19 mm right ovarian cyst or
follicle with associated free fluid in the cul-de-sac. Findings may
reflect a ruptured/hemorrhagic cyst or corpus luteum.
2. Atrophic left kidney with compensatory hypertrophy of the right
kidney as before. No obstructive uropathy.
3. No acute bowel obstruction or inflammation.
4. Unremarkable appearance of the pancreas with resolved
peripancreatic inflammation since prior exam. No pseudocyst
formation or ductal dilatation. It should be noted that the pancreas
can appear normal on CT despite clinical pancreatitis and therefore
laboratory correlation is recommended.

## 2018-06-27 DIAGNOSIS — E1169 Type 2 diabetes mellitus with other specified complication: Secondary | ICD-10-CM | POA: Diagnosis not present

## 2018-06-27 DIAGNOSIS — E785 Hyperlipidemia, unspecified: Secondary | ICD-10-CM | POA: Diagnosis not present

## 2018-06-27 DIAGNOSIS — E669 Obesity, unspecified: Secondary | ICD-10-CM | POA: Diagnosis not present

## 2018-06-27 DIAGNOSIS — Z9989 Dependence on other enabling machines and devices: Secondary | ICD-10-CM | POA: Diagnosis not present

## 2018-06-27 DIAGNOSIS — G4733 Obstructive sleep apnea (adult) (pediatric): Secondary | ICD-10-CM | POA: Diagnosis not present

## 2018-06-27 DIAGNOSIS — E781 Pure hyperglyceridemia: Secondary | ICD-10-CM | POA: Diagnosis not present

## 2018-06-27 DIAGNOSIS — F320 Major depressive disorder, single episode, mild: Secondary | ICD-10-CM | POA: Diagnosis not present

## 2018-07-03 ENCOUNTER — Ambulatory Visit (INDEPENDENT_AMBULATORY_CARE_PROVIDER_SITE_OTHER): Payer: Medicare HMO | Admitting: Psychiatry

## 2018-07-03 ENCOUNTER — Other Ambulatory Visit: Payer: Self-pay

## 2018-07-03 ENCOUNTER — Encounter: Payer: Self-pay | Admitting: Psychiatry

## 2018-07-03 DIAGNOSIS — F411 Generalized anxiety disorder: Secondary | ICD-10-CM | POA: Diagnosis not present

## 2018-07-03 DIAGNOSIS — F331 Major depressive disorder, recurrent, moderate: Secondary | ICD-10-CM | POA: Diagnosis not present

## 2018-07-03 DIAGNOSIS — F5105 Insomnia due to other mental disorder: Secondary | ICD-10-CM

## 2018-07-03 DIAGNOSIS — F4010 Social phobia, unspecified: Secondary | ICD-10-CM

## 2018-07-03 NOTE — Progress Notes (Signed)
Virtual Visit via Video Note  I connected with Meredith Williams on 07/03/18 at 10:30 AM EDT by a video enabled telemedicine application and verified that I am speaking with the correct person using two identifiers.   I discussed the limitations of evaluation and management by telemedicine and the availability of in person appointments. The patient expressed understanding and agreed to proceed.    I discussed the assessment and treatment plan with the patient. The patient was provided an opportunity to ask questions and all were answered. The patient agreed with the plan and demonstrated an understanding of the instructions.   The patient was advised to call back or seek an in-person evaluation if the symptoms worsen or if the condition fails to improve as anticipated.   Kirvin MD OP Progress Note  07/03/2018 12:53 PM Meredith Williams  MRN:  [id number]  Chief Complaint:  Chief Complaint    Follow-up     HPI: Meredith Williams is a 38 year old Caucasian female, single, lives in [place], on [place], has a history of MDD, GAD, social anxiety disorder, insomnia, myotonic dystrophy, diabetes melitis, migraine headaches was evaluated by telemedicine today.  Patient today reports she is currently doing well.  She is at the beach with a friend and is feeling better.  She reports she continues to take the Prozac and lamotrigine which are helpful.  Patient denies any significant problems with her sleep or appetite.  She continues to have pain however it is more under control.  She denies any suicidality, homicidality or perceptual disturbances.  Patient denies any other concerns today. Visit Diagnosis:    ICD-10-CM   1. MDD (major depressive disorder), recurrent episode, moderate (HCC)  F33.1   2. GAD (generalized anxiety disorder)  F41.1   3. Social anxiety disorder  F40.10   4. Insomnia due to mental disorder  F51.05     Past Psychiatric History: Reviewed past psychiatric history from my progress note on  04/16/2017.  Past trials of Cymbalta-ineffective, Viibryd, BuSpar, Effexor.  Past Medical History:  Past Medical History:  Diagnosis Date  . Anxiety   . Depression   . Diabetes mellitus without complication ([place])   . Hyperlipidemia   . Muscular dystrophy, myotonic ([place])   . Pancreatitis     Past Surgical History:  Procedure Laterality Date  . ABDOMINAL HYSTERECTOMY    . HIP SURGERY Right 2016   "removal of a bone growth"    Family Psychiatric History: Reviewed family psychiatric history from my progress note on 04/16/2017.  Family History:  Family History  Problem Relation Age of Onset  . Stroke Mother   . Muscular dystrophy Mother   . Drug abuse Brother   . Depression Sister   . Anxiety disorder Sister   . Muscular dystrophy Sister   . Depression Sister     Social History: Reviewed social history from my progress note on 04/16/2017 Social History   Socioeconomic History  . Marital status: Single    Spouse name: Not on file  . Number of children: 0  . Years of education: Not on file  . Highest education level: Some college, no degree  Occupational History    Comment: disability  Social Needs  . Financial resource strain: Not hard at all  . Food insecurity    Worry: Never true    Inability: Never true  . Transportation needs    Medical: Yes    Non-medical: Yes  Tobacco Use  . Smoking status: Passive Smoke Exposure - Never Smoker  .  Smokeless tobacco: Never Used  Substance and Sexual Activity  . Alcohol use: No    Alcohol/week: 0.0 standard drinks    Frequency: Never  . Drug use: No  . Sexual activity: Yes    Birth control/protection: Pill  Lifestyle  . Physical activity    Days per week: Not on file    Minutes per session: Not on file  . Stress: Very much  Relationships  . Social connections    Talks on phone: More than three times a week    Gets together: More than three times a week    Attends religious service: Never    Active member of club or  organization: No    Attends meetings of clubs or organizations: Never    Relationship status: Never married  Other Topics Concern  . Not on file  Social History Narrative   Patient went to the ED d/t pain in her side.  Every time she eats, she has increased pain.  Ob/gyn reported after CT that she had an ovarian cyst.  She will be going ob/gyn today for an injection to help with this.     Allergies:  Allergies  Allergen Reactions  . Other Other (See Comments)    Patient states that she is allergic to any medication that is in a patch form as it will "go into her muscle."    Metabolic Disorder Labs: No results found for: HGBA1C, MPG No results found for: PROLACTIN Lab Results  Component Value Date   CHOL 168 06/15/2016   TRIG 285 (H) 06/15/2016   HDL 44 06/15/2016   CHOLHDL 3.8 06/15/2016   VLDL 57 (H) 06/15/2016   LDLCALC 67 06/15/2016   Lab Results  Component Value Date   TSH 0.900 03/07/2018    Therapeutic Level Labs: No results found for: LITHIUM No results found for: VALPROATE No components found for:  CBMZ  Current Medications: Current Outpatient Medications  Medication Sig Dispense Refill  . aspirin EC 81 MG tablet Take 81 mg by mouth daily.    . cyclobenzaprine (FLEXERIL) 10 MG tablet Take 15 mg twice a day    . FLUoxetine (PROZAC) 40 MG capsule Take 1 capsule (40 mg total) by mouth daily. 90 capsule 0  . fluticasone (FLONASE) 50 MCG/ACT nasal spray Place into the nose.    . furosemide (LASIX) 20 MG tablet TAKE 1 TABLET(20 MG) BY MOUTH EVERY DAY AS NEEDED FOR SWELLING    . JUNEL FE 24 1-20 MG-MCG(24) tablet Take 1 tablet by mouth daily.  0  . lamoTRIgine (LAMICTAL) 25 MG tablet Take 2 tablets (50 mg total) by mouth daily. 180 tablet 1  . metFORMIN (GLUCOPHAGE-XR) 500 MG 24 hr tablet Take by mouth.    . nortriptyline (PAMELOR) 50 MG capsule Take by mouth.    Marland Kitchen. oxymetazoline (NASAL RELIEF) 0.05 % nasal spray Place into the nose.    . pregabalin (LYRICA) 100 MG  capsule 100 mg in the qAM, 200 mg qPM 90 capsule 5  . propranolol (INDERAL) 10 MG tablet Take 1 tablet (10 mg total) by mouth 3 (three) times daily as needed. 270 tablet 1  . TRESIBA FLEXTOUCH 100 UNIT/ML SOPN FlexTouch Pen INJECT 50 UNITS SUBCUTANEOUSLY ONCE DAILY  11  . triamcinolone cream (KENALOG) 0.5 % Apply topically.    Marland Kitchen. zolpidem (AMBIEN) 10 MG tablet TAKE 1 TABLET(10 MG) BY MOUTH AT BEDTIME AS NEEDED FOR SLEEP 30 tablet 2  . norgestrel-ethinyl estradiol (OGESTREL) 0.5-50 MG-MCG tablet Take by mouth.  No current facility-administered medications for this visit.      Musculoskeletal: Strength & Muscle Tone: within normal limits Gait & Station: normal Patient leans: N/A  Psychiatric Specialty Exam: Review of Systems  Psychiatric/Behavioral: The patient is not nervous/anxious.   All other systems reviewed and are negative.   There were no vitals taken for this visit.There is no height or weight on file to calculate BMI.  General Appearance: Casual  Eye Contact:  Fair  Speech:  Clear and Coherent  Volume:  Normal  Mood:  Euthymic  Affect:  Appropriate  Thought Process:  Goal Directed and Descriptions of Associations: Intact  Orientation:  Full (Time, Place, and Person)  Thought Content: Logical   Suicidal Thoughts:  No  Homicidal Thoughts:  No  Memory:  Immediate;   Fair Recent;   Fair Remote;   Fair  Judgement:  Fair  Insight:  Fair  Psychomotor Activity:  Normal  Concentration:  Concentration: Fair and Attention Span: Fair  Recall:  [employer]  Fund of Knowledge: Fair  Language: Fair  Akathisia:  No  Handed:  Right  AIMS (if indicated):denies tremors, rigidity  Assets:  Communication Skills Desire for Improvement Housing Social Support  ADL's:  Intact  Cognition: WNL  Sleep:  Fair   Screenings: PHQ2-9     [facility] Office [facility] Clinical [facility]  PHQ-2 Total Score  3  3  0  0  0  PHQ-9 Total Score  12  11  -  -  -       Assessment and Plan: Meredith Williams is a 38 year old Caucasian female who has a history of MDD, insomnia, anxiety, chronic pain, myotonic dystrophy, diabetes, migraine headaches was evaluated by telemedicine today.  Patient reports she is more compliant with her medications now and is currently doing well.  Plan as noted below.  Plan  MDD- improving Prozac 40 mg p.o. daily Pamelor as prescribed by neurology.  Lamictal 50 mg p.o. daily  For anxiety disorder-improving Propanolol 10 mg p.o. 3 times daily as needed Prozac as prescribed  For insomnia-improving Ambien 10 mg p.o. nightly as needed Continue CPAP  Follow-up in clinic in 1 to 2 months or sooner if needed.  August 26 at 10:30 AM.  I have spent atleast 15 minutes non face to face with patient today. More than 50 % of the time was spent for psychoeducation and supportive psychotherapy and care coordination.  This note was generated in part or whole with voice recognition software. Voice recognition is usually quite accurate but there are transcription errors that can and very often do occur. I apologize for any typographical errors that were not detected and corrected.         Jomarie LongsSaramma Shaketta Rill, MD 07/03/2018, 12:53 PM

## 2018-07-09 ENCOUNTER — Other Ambulatory Visit: Payer: Self-pay | Admitting: Psychiatry

## 2018-07-09 DIAGNOSIS — F411 Generalized anxiety disorder: Secondary | ICD-10-CM

## 2018-07-09 DIAGNOSIS — F331 Major depressive disorder, recurrent, moderate: Secondary | ICD-10-CM

## 2018-07-17 ENCOUNTER — Encounter: Payer: Self-pay | Admitting: Student in an Organized Health Care Education/Training Program

## 2018-07-17 ENCOUNTER — Other Ambulatory Visit: Payer: Self-pay

## 2018-07-17 ENCOUNTER — Ambulatory Visit
Payer: Medicare HMO | Attending: Student in an Organized Health Care Education/Training Program | Admitting: Student in an Organized Health Care Education/Training Program

## 2018-07-17 VITALS — BP 118/107 | HR 92 | Temp 98.9°F | Resp 16 | Ht 66.0 in | Wt 207.6 lb

## 2018-07-17 DIAGNOSIS — F331 Major depressive disorder, recurrent, moderate: Secondary | ICD-10-CM | POA: Diagnosis not present

## 2018-07-17 DIAGNOSIS — G43719 Chronic migraine without aura, intractable, without status migrainosus: Secondary | ICD-10-CM

## 2018-07-17 DIAGNOSIS — M545 Low back pain: Secondary | ICD-10-CM

## 2018-07-17 DIAGNOSIS — G894 Chronic pain syndrome: Secondary | ICD-10-CM | POA: Diagnosis not present

## 2018-07-17 DIAGNOSIS — G7100 Muscular dystrophy, unspecified: Secondary | ICD-10-CM

## 2018-07-17 DIAGNOSIS — G8929 Other chronic pain: Secondary | ICD-10-CM

## 2018-07-17 DIAGNOSIS — M791 Myalgia, unspecified site: Secondary | ICD-10-CM | POA: Diagnosis not present

## 2018-07-17 NOTE — Progress Notes (Deleted)
Patient's Name: Meredith Williams  MRN: [id number]  Referring Provider: Dion Body, MD  DOB: [birth year]  PCP: Dion Body, MD  DOS: 07/17/2018  Note by: Gillis Santa, MD  Service setting: Ambulatory outpatient  Attending: Gillis Santa, MD  Location: [facility] (AMB) Pain Management Facility  Specialty: Interventional Pain Management  Patient type: Established   Primary Reason(s) for Visit: Encounter for prescription drug management. (Level of risk: moderate)  CC: Generalized Body Aches (muscular dystrophy )  HPI  Meredith Williams is a 38 y.o. year old, female patient, who comes today for a medication management evaluation. She has Acute pancreatitis; Pancreatitis; Insomnia; Hypertriglyceridemia; Encounter for therapeutic drug monitoring; Diabetes mellitus without complication ([place]); Depression; Chronic pain syndrome; Anxiety; Cocaine use; Intractable chronic migraine without aura and without status migrainosus; Raynaud phenomenon; Primary osteoarthritis of right hip; Myotonic muscular dystrophy ([place]); Raynaud's syndrome without gangrene; Vitamin D deficiency; Vitamin B12 deficiency; Right hip impingement syndrome; Muscular dystrophy ([place]); Chronic bilateral low back pain without sciatica; Myalgia; MDD (major depressive disorder), recurrent episode, moderate ([place]); Sweating abnormality; OSA (obstructive sleep apnea); and OSA on CPAP on their problem list. Her primarily concern today is the Generalized Body Aches (muscular dystrophy )  Pain Assessment: Location: Lower, Medial Back(pain caused by muscular dystrophy) Radiating: denies Onset: More than a month ago Duration: Chronic pain Quality: Stabbing, Discomfort Severity: 5 /10 (subjective, self-reported pain score)  Note: Reported level is compatible with observation.                         When using our objective Pain Scale, levels between 6 and 10/10 are said to belong in an emergency room, as it progressively worsens from a 6/10, described as  severely limiting, requiring emergency care not usually available at an outpatient pain management facility. At a 6/10 level, communication becomes difficult and requires great effort. Assistance to reach the emergency department may be required. Facial flushing and profuse sweating along with potentially dangerous increases in heart rate and blood pressure will be evident. Effect on ADL: limiting Timing: Intermittent Modifying factors: standing up relieves but only for short period BP: (!) 118/107  HR: 92  Meredith Williams was last scheduled for an appointment on 03/18/2018 for medication management. During today's appointment we reviewed Meredith Williams chronic pain status, as well as her outpatient medication regimen.  The patient  reports no history of drug use. Her body mass index is 33.51 kg/m.  Further details on both, my assessment(s), as well as the proposed treatment plan, please see below.  Controlled Substance Pharmacotherapy Assessment REMS (Risk Evaluation and Mitigation Strategy)  Analgesic: *** MME/day: *** mg/day.  Meredith Billow, Meredith Williams  07/17/2018  8:15 AM  Sign when Signing Visit Safety precautions to be maintained throughout the outpatient stay will include: orient to surroundings, keep bed in low position, maintain call bell within reach at all times, provide assistance with transfer out of bed and ambulation.  Pharmacokinetics: Liberation and absorption (onset of action): WNL Distribution (time to peak effect): WNL Metabolism and excretion (duration of action): WNL         Pharmacodynamics: Desired effects: Analgesia: Meredith Williams reports >50% benefit. Functional ability: Patient reports that medication allows her to accomplish basic ADLs Clinically meaningful improvement in function (CMIF): Sustained CMIF goals met Perceived effectiveness: Described as relatively effective, allowing for increase in activities of daily living (ADL) Undesirable effects: Side-effects or Adverse  reactions: None reported Monitoring: [place] PMP: PDMP not reviewed this encounter. Online review  of the past 30-monthperiod conducted. Compliant with practice rules and regulations Last UDS on record: No results found for: SUMMARY UDS interpretation: Compliant          Medication Assessment Form: Reviewed. Patient indicates being compliant with therapy Treatment compliance: Compliant Risk Assessment Profile: Aberrant behavior: See initial evaluations. None observed or detected today Comorbid factors increasing risk of overdose: See initial evaluation. No additional risks detected today Opioid risk tool (ORT):  Opioid Risk  07/17/2018  Alcohol 0  Illegal Drugs 0  Rx Drugs 4  Alcohol 0  Illegal Drugs 0  Rx Drugs 0  Age between 16-45 years  1  History of Preadolescent Sexual Abuse -  Psychological Disease 2  ADD Negative  OCD Negative  Bipolar Negative  Depression 1  Opioid Risk Tool Scoring 8  Opioid Risk Interpretation High Risk    ORT Scoring interpretation table:  Score <3 = Low Risk for SUD  Score between 4-7 = Moderate Risk for SUD  Score >8 = High Risk for Opioid Abuse   Risk of substance use disorder (SUD): Low  Risk Mitigation Strategies:  Patient Counseling: Covered Patient-Prescriber Agreement (PPA): Present and active  Notification to other healthcare providers: Done  Pharmacologic Plan: No change in therapy, at this time.             Laboratory Chemistry   SAFETY SCREENING Profile Lab Results  Component Value Date   HIV Non Reactive 06/16/2016   PREGTESTUR NEGATIVE 03/07/2018   Inflammation Markers (CRP: Acute Phase) (ESR: Chronic Phase) Lab Results  Component Value Date   LATICACIDVEN 0.9 06/15/2016                         Rheumatology Markers No results found for: RF, ANA, LABURIC, URICUR, LYMEIGGIGMAB, LYMEABIGMQN, HLAB27                      Renal Function Markers Lab Results  Component Value Date   BUN 28 (H) 03/07/2018   CREATININE 1.15  (H) 03/07/2018   GFRAA >60 03/07/2018   GFRNONAA >60 03/07/2018                             Hepatic Function Markers Lab Results  Component Value Date   AST 25 03/07/2018   ALT 24 03/07/2018   ALBUMIN 3.6 03/07/2018   ALKPHOS 28 (L) 03/07/2018   LIPASE 28 12/08/2017                        Electrolytes Lab Results  Component Value Date   NA 140 03/07/2018   K 3.6 03/07/2018   CL 99 03/07/2018   CALCIUM 9.5 03/07/2018   MG 2.4 06/22/2016   PHOS 2.7 06/22/2016                        Neuropathy Markers Lab Results  Component Value Date   HIV Non Reactive 06/16/2016                        CNS Tests No results found for: COLORCSF, APPEARCSF, RBCCOUNTCSF, WBCCSF, POLYSCSF, LYMPHSCSF, EOSCSF, PROTEINCSF, GLUCCSF, JCVIRUS, CSFOLI, IGGCSF, LABACHR, ACETBL                      Bone Pathology Markers No results found for: [place] [id number]  [id number], 25OHVITD1, 25OHVITD2, 25OHVITD3, TESTOFREE, TESTOSTERONE                       Coagulation Parameters Lab Results  Component Value Date   PLT 304 03/07/2018                        Cardiovascular Markers Lab Results  Component Value Date   BNP 19.0 03/07/2018   TROPONINI <0.03 03/07/2018   HGB 15.4 (H) 03/07/2018   HCT 47.8 (H) 03/07/2018                         ID Test(s) Lab Results  Component Value Date   HIV Non Reactive 06/16/2016   PREGTESTUR NEGATIVE 03/07/2018    CA Markers No results found for: CEA, CA125, LABCA2                      Endocrine Markers Lab Results  Component Value Date   TSH 0.900 03/07/2018                        Note: Lab results reviewed.  Recent Diagnostic Imaging Results  MR BRAIN WO CONTRAST CLINICAL DATA:  Intractable migraine headache.  Recent fall.  EXAM: MRI HEAD WITHOUT CONTRAST  TECHNIQUE: Multiplanar, multiecho pulse sequences of the brain and surrounding structures were obtained without intravenous contrast.  COMPARISON:  Head CT 03/07/2018.  MRI  01/07/2016.  FINDINGS: Brain: Diffusion imaging does not show any acute or subacute infarction. The brainstem and cerebellum are normal. Cerebral hemispheres show scattered punctate foci of T2 and FLAIR signal within the hemispheric white matter. There is a larger region of abnormal white matter signal in the left frontal radiating white matter tracks. All of these appear unchanged since 2017. In a patient with a history of chronic migraine, these are most consistent with migraine related foci, though the larger lesion in the left frontal lobe is atypical. Because this is not progressive, it is not felt to be intrinsically significant otherwise. Lack of progression since 2017 essentially rules out CADASIL.  No cortical insult. No evidence of mass lesion, hemorrhage, hydrocephalus or extra-axial collection.  Vascular: Major vessels at the base of the brain show flow.  Skull and upper cervical spine: Negative  Sinuses/Orbits: Clear/normal  Other: None  IMPRESSION: Stable examination since 2017. Scattered foci of abnormal T2 and FLAIR white matter signal as outlined above, most consistent with migraine related foci. See full discussion  Electronically Signed   By: Nelson Chimes M.D.   On: 03/10/2018 07:38  Complexity Note: Imaging results reviewed. Results shared with Meredith Williams, using Layman's terms.                               Meds   Current Outpatient Medications:  .  cyclobenzaprine (FLEXERIL) 10 MG tablet, Take 15 mg twice a day, Disp: , Rfl:  .  FLUoxetine (PROZAC) 40 MG capsule, Take 1 capsule (40 mg total) by mouth daily., Disp: 90 capsule, Rfl: 0 .  furosemide (LASIX) 20 MG tablet, TAKE 1 TABLET(20 MG) BY MOUTH EVERY DAY AS NEEDED FOR SWELLING, Disp: , Rfl:  .  lamoTRIgine (LAMICTAL) 25 MG tablet, Take 2 tablets (50 mg total) by mouth daily., Disp: 180 tablet, Rfl: 1 .  oxymetazoline (NASAL RELIEF) 0.05 % nasal spray, Place into the  nose., Disp: , Rfl:  .  pregabalin  (LYRICA) 100 MG capsule, 100 mg in the qAM, 200 mg qPM, Disp: 90 capsule, Rfl: 5 .  TRESIBA FLEXTOUCH 100 UNIT/ML SOPN FlexTouch Pen, INJECT 50 UNITS SUBCUTANEOUSLY ONCE DAILY, Disp: , Rfl: 11 .  zolpidem (AMBIEN) 10 MG tablet, TAKE 1 TABLET(10 MG) BY MOUTH AT BEDTIME AS NEEDED FOR SLEEP, Disp: 30 tablet, Rfl: 2 .  aspirin EC 81 MG tablet, Take 81 mg by mouth daily., Disp: , Rfl:  .  fluticasone (FLONASE) 50 MCG/ACT nasal spray, Place into the nose., Disp: , Rfl:  .  JUNEL FE 24 1-20 MG-MCG(24) tablet, Take 1 tablet by mouth daily., Disp: , Rfl: 0 .  metFORMIN (GLUCOPHAGE-XR) 500 MG 24 hr tablet, Take by mouth., Disp: , Rfl:  .  norgestrel-ethinyl estradiol (OGESTREL) 0.5-50 MG-MCG tablet, Take by mouth., Disp: , Rfl:  .  nortriptyline (PAMELOR) 50 MG capsule, Take by mouth., Disp: , Rfl:  .  propranolol (INDERAL) 10 MG tablet, Take 1 tablet (10 mg total) by mouth 3 (three) times daily as needed. (Patient not taking: Reported on 07/17/2018), Disp: 270 tablet, Rfl: 1 .  triamcinolone cream (KENALOG) 0.5 %, Apply topically., Disp: , Rfl:   ROS  Constitutional: Denies any fever or chills Gastrointestinal: No reported hemesis, hematochezia, vomiting, or acute GI distress Musculoskeletal: Denies any acute onset joint swelling, redness, loss of ROM, or weakness Neurological: No reported episodes of acute onset apraxia, aphasia, dysarthria, agnosia, amnesia, paralysis, loss of coordination, or loss of consciousness  Allergies  Meredith Williams is allergic to other.  PFSH  Drug: Meredith Williams  reports no history of drug use. Alcohol:  reports no history of alcohol use. Tobacco:  reports that she is a non-smoker but has been exposed to tobacco smoke. She has never used smokeless tobacco. Medical:  has a past medical history of Anxiety, Depression, Diabetes mellitus without complication ([place]), Hyperlipidemia, Muscular dystrophy, myotonic ([place]), and Pancreatitis. Surgical: Ms. Geving  has a past surgical history that  includes Abdominal hysterectomy and Hip surgery (Right, 2016). Family: family history includes Anxiety disorder in her sister; Depression in her sister and sister; Drug abuse in her brother; Muscular dystrophy in her mother and sister; Stroke in her mother.  Constitutional Exam  General appearance: Well nourished, well developed, and well hydrated. In no apparent acute distress Vitals:   07/17/18 0807  BP: (!) 118/107  Pulse: 92  Resp: 16  Temp: 98.9 F (37.2 C)  TempSrc: Oral  SpO2: 96%  Weight: 207 lb 9.6 oz (94.2 kg)  Height: _0  (1.676 m)   BMI Assessment: Estimated body mass index is 33.51 kg/m as calculated from the following:   Height as of this encounter: _1  (1.676 m).   Weight as of this encounter: 207 lb 9.6 oz (94.2 kg).  BMI interpretation table: BMI level Category Range association with higher incidence of chronic pain  <18 kg/m2 Underweight   18.5-24.9 kg/m2 Ideal body weight   25-29.9 kg/m2 Overweight Increased incidence by 20%  30-34.9 kg/m2 Obese (Class I) Increased incidence by 68%  35-39.9 kg/m2 Severe obesity (Class II) Increased incidence by 136%  >40 kg/m2 Extreme obesity (Class III) Increased incidence by 254%   Patient's current BMI Ideal Body weight  Body mass index is 33.51 kg/m. Ideal body weight: 59.3 kg (130 lb 11.7 oz) Adjusted ideal body weight: 73.2 kg (161 lb 7.7 oz)   BMI Readings from Last 4 Encounters:  07/17/18 33.51 kg/m  03/07/18 32.58  kg/m  03/09/18 35.24 kg/m  02/18/18 34.61 kg/m   Wt Readings from Last 4 Encounters:  07/17/18 207 lb 9.6 oz (94.2 kg)  03/07/18 208 lb (94.3 kg)  03/09/18 225 lb (102.1 kg)  02/18/18 221 lb (100.2 kg)  Psych/Mental status: Alert, oriented x 3 (person, place, & time)       Eyes: PERLA Respiratory: No evidence of acute respiratory distress  Cervical Spine Area Exam  Skin & Axial Inspection: No masses, redness, edema, swelling, or associated skin lesions Alignment:  Symmetrical Functional ROM: Unrestricted ROM      Stability: No instability detected Muscle Tone/Strength: Functionally intact. No obvious neuro-muscular anomalies detected. Sensory (Neurological): Unimpaired Palpation: No palpable anomalies              Upper Extremity (UE) Exam    Side: Right upper extremity  Side: Left upper extremity  Skin & Extremity Inspection: Skin color, temperature, and hair growth are WNL. No peripheral edema or cyanosis. No masses, redness, swelling, asymmetry, or associated skin lesions. No contractures.  Skin & Extremity Inspection: Skin color, temperature, and hair growth are WNL. No peripheral edema or cyanosis. No masses, redness, swelling, asymmetry, or associated skin lesions. No contractures.  Functional ROM: Unrestricted ROM          Functional ROM: Unrestricted ROM          Muscle Tone/Strength: Functionally intact. No obvious neuro-muscular anomalies detected.  Muscle Tone/Strength: Functionally intact. No obvious neuro-muscular anomalies detected.  Sensory (Neurological): Unimpaired          Sensory (Neurological): Unimpaired          Palpation: No palpable anomalies              Palpation: No palpable anomalies              Provocative Test(s):  Phalen's test: deferred Tinel's test: deferred Apley's scratch test (touch opposite shoulder):  Action 1 (Across chest): deferred Action 2 (Overhead): deferred Action 3 (LB reach): deferred   Provocative Test(s):  Phalen's test: deferred Tinel's test: deferred Apley's scratch test (touch opposite shoulder):  Action 1 (Across chest): deferred Action 2 (Overhead): deferred Action 3 (LB reach): deferred    Thoracic Spine Area Exam  Skin & Axial Inspection: No masses, redness, or swelling Alignment: Symmetrical Functional ROM: Unrestricted ROM Stability: No instability detected Muscle Tone/Strength: Functionally intact. No obvious neuro-muscular anomalies detected. Sensory (Neurological):  Unimpaired Muscle strength & Tone: No palpable anomalies  Lumbar Spine Area Exam  Skin & Axial Inspection: No masses, redness, or swelling Alignment: Symmetrical Functional ROM: Unrestricted ROM       Stability: No instability detected Muscle Tone/Strength: Functionally intact. No obvious neuro-muscular anomalies detected. Sensory (Neurological): Unimpaired Palpation: No palpable anomalies       Provocative Tests: Hyperextension/rotation test: deferred today       Lumbar quadrant test (Kemp's test): deferred today       Lateral bending test: deferred today       Patrick's Maneuver: deferred today                   FABER* test: deferred today                   S-I anterior distraction/compression test: deferred today         S-I lateral compression test: deferred today         S-I Thigh-thrust test: deferred today         S-I Gaenslen's test: deferred today         *(  Flexion, ABduction and External Rotation)  Gait & Posture Assessment  Ambulation: Unassisted Gait: Relatively normal for age and body habitus Posture: WNL   Lower Extremity Exam    Side: Right lower extremity  Side: Left lower extremity  Stability: No instability observed          Stability: No instability observed          Skin & Extremity Inspection: Skin color, temperature, and hair growth are WNL. No peripheral edema or cyanosis. No masses, redness, swelling, asymmetry, or associated skin lesions. No contractures.  Skin & Extremity Inspection: Skin color, temperature, and hair growth are WNL. No peripheral edema or cyanosis. No masses, redness, swelling, asymmetry, or associated skin lesions. No contractures.  Functional ROM: Unrestricted ROM                  Functional ROM: Unrestricted ROM                  Muscle Tone/Strength: Functionally intact. No obvious neuro-muscular anomalies detected.  Muscle Tone/Strength: Functionally intact. No obvious neuro-muscular anomalies detected.  Sensory (Neurological):  Unimpaired        Sensory (Neurological): Unimpaired        DTR: Patellar: deferred today Achilles: deferred today Plantar: deferred today  DTR: Patellar: deferred today Achilles: deferred today Plantar: deferred today  Palpation: No palpable anomalies  Palpation: No palpable anomalies   Assessment   Status Diagnosis  Controlled Controlled Controlled No diagnosis found.   Updated Problems: No problems updated.  Plan of Care  Pharmacotherapy (Medications Ordered): No orders of the defined types were placed in this encounter.  Medications administered today: Meredith Worland. Pong had no medications administered during this visit.  Orders:  No orders of the defined types were placed in this encounter.  Lab Orders  No laboratory test(s) ordered today   Imaging Orders  No imaging studies ordered today   Referral Orders  No referral(s) requested today   Planned follow-up:   No follow-ups on file. ***   Interventional management options: Considering:   ***   Palliative PRN treatment(s):   None at this time   Recent Visits No visits were found meeting these conditions.  Showing recent visits within past 90 days and meeting all other requirements   Today's Visits Date Type Provider Dept  07/17/18 Office Visit Gillis Santa, MD [facility]  Showing today's visits and meeting all other requirements   Future Appointments No visits were found meeting these conditions.  Showing future appointments within next 90 days and meeting all other requirements   Primary Care Physician: Dion Body, MD Location: [facility] Note by: Gillis Santa, MD Date: 07/17/2018; Time: 8:17 AM  Note: This dictation was prepared with Dragon dictation. Any transcriptional errors that may result from this process are unintentional.

## 2018-07-17 NOTE — Progress Notes (Signed)
Safety precautions to be maintained throughout the outpatient stay will include: orient to surroundings, keep bed in low position, maintain call bell within reach at all times, provide assistance with transfer out of bed and ambulation.  

## 2018-07-17 NOTE — Progress Notes (Signed)
Patient's Name: Meredith LuzSarah K Klugh  MRN: [id number]  Referring Provider: Marisue IvanLinthavong, Kanhka, MD  DOB: April 10, 1980  PCP: Marisue IvanLinthavong, Kanhka, MD  DOS: 07/17/2018  Note by: Edward JollyBilal Ayiana Winslett, MD  Service setting: Ambulatory outpatient  Attending: Edward JollyBilal Duston Smolenski, MD  Location: [facility] (AMB) Pain Management Facility  Specialty: Interventional Pain Management  Patient type: Established   CC: Generalized Body Aches (muscular dystrophy )  HPI: Ms. Meredith Williams is a 38 y.o. year old, female patient, who comes today complaining of Generalized Body Aches (muscular dystrophy ) Her last contact with us was on 03/18/2018. I personally saw her on 03/18/2018. Severity of the pain is described as a 5 /10. Her pain is located in the area of the Back(pain caused by muscular dystrophy) Lower, Medial and denies. Onset: More than a month ago. Pain is descriptors include: Stabbing, Discomfort. Tempo is described as: Intermittent. Modifying factors: standing up relieves but only for short period.  Patient follows up today for medication management.  She states that she was experiencing falls with Lyrica and as result has discontinued the medication.  She has not noticed significant worsening of her pain as a result of Lyrica.  Patient does continue her Flexeril and Cymbalta which does provide her with pain management.  She states that her baseline pain is around a 5 even with being off Lyrica which is a level that was at before.  Since I was only managing the patient's Lyrica and because she has discontinued this medication I instructed the patient to follow-up PRN.  ROS: Denies any new onset of joint swelling, redness, loss of ROM, or weakness. No reported episodes of acute onset apraxia, aphasia, dysarthria, agnosia, amnesia, paralysis, loss of coordination, or loss of consciousness.  Exam: Ms. Meredith Williams's  height is 5\' 6"  (1.676 m) and weight is 207 lb 9.6 oz (94.2 kg). Her oral temperature is 98.9 F (37.2 C). Her blood pressure is 118/107  (abnormal) and her pulse is 92. Her respiration is 16 and oxygen saturation is 96%.  Body mass index is 33.51 kg/m.   Cervical Spine Area Exam  Skin & Axial Inspection: No masses, redness, edema, swelling, or associated skin lesions Alignment: Symmetrical Functional ROM: Decreased ROM      Stability: No instability detected Muscle Tone/Strength: Functionally intact. No obvious neuro-muscular anomalies detected. Sensory (Neurological): Musculoskeletal pain pattern  Palpation: No palpable anomalies               Lumbar Spine Area Exam  Skin & Axial Inspection: No masses, redness, or swelling Alignment: Symmetrical Functional ROM: Decreased ROM affecting both sides Stability: No instability detected Muscle Tone/Strength: Functionally intact. No obvious neuro-muscular anomalies detected. Sensory (Neurological): Musculoskeletal pain pattern  A/P: The primary encounter diagnosis was Chronic pain syndrome. Diagnoses of Muscular dystrophy (HCC), Chronic bilateral low back pain without sciatica, Myalgia, MDD (major depressive disorder), recurrent episode, moderate (HCC), and Intractable chronic migraine without aura and without status migrainosus were also pertinent to this visit. I have discontinued Meredith Williams's pregabalin. I am also having her maintain her aspirin EC, Junel Fe 24, norgestrel-ethinyl estradiol, triamcinolone cream, Tresiba FlexTouch, fluticasone, furosemide, cyclobenzaprine, nortriptyline, propranolol, metFORMIN, lamoTRIgine, zolpidem, FLUoxetine, and Nasal Relief. Return if symptoms worsen or fail to improve.  Orders:  No orders of the defined types were placed in this encounter.  I discussed the assessment and treatment plan with the patient. The patient was provided an opportunity to ask questions and all were answered. The patient agreed with the plan and demonstrated an  understanding of the instructions.  Patient advised to call back if the symptoms or condition  worsens.  Note by: Gillis Santa, MD Date: 07/17/2018; Time: 8:27 AM

## 2018-07-18 DIAGNOSIS — G4733 Obstructive sleep apnea (adult) (pediatric): Secondary | ICD-10-CM | POA: Diagnosis not present

## 2018-07-31 DIAGNOSIS — E1169 Type 2 diabetes mellitus with other specified complication: Secondary | ICD-10-CM | POA: Diagnosis not present

## 2018-07-31 DIAGNOSIS — E785 Hyperlipidemia, unspecified: Secondary | ICD-10-CM | POA: Diagnosis not present

## 2018-07-31 DIAGNOSIS — E781 Pure hyperglyceridemia: Secondary | ICD-10-CM | POA: Diagnosis not present

## 2018-08-15 DIAGNOSIS — M79674 Pain in right toe(s): Secondary | ICD-10-CM | POA: Diagnosis not present

## 2018-08-15 DIAGNOSIS — M79675 Pain in left toe(s): Secondary | ICD-10-CM | POA: Diagnosis not present

## 2018-08-15 DIAGNOSIS — L600 Ingrowing nail: Secondary | ICD-10-CM | POA: Diagnosis not present

## 2018-08-23 DEATH — deceased

## 2018-09-15 DIAGNOSIS — H5203 Hypermetropia, bilateral: Secondary | ICD-10-CM | POA: Diagnosis not present

## 2018-09-17 ENCOUNTER — Encounter: Payer: Self-pay | Admitting: Psychiatry

## 2018-09-17 ENCOUNTER — Other Ambulatory Visit: Payer: Self-pay

## 2018-09-17 ENCOUNTER — Ambulatory Visit (INDEPENDENT_AMBULATORY_CARE_PROVIDER_SITE_OTHER): Payer: Medicare HMO | Admitting: Psychiatry

## 2018-09-17 DIAGNOSIS — F4010 Social phobia, unspecified: Secondary | ICD-10-CM | POA: Diagnosis not present

## 2018-09-17 DIAGNOSIS — F3341 Major depressive disorder, recurrent, in partial remission: Secondary | ICD-10-CM

## 2018-09-17 DIAGNOSIS — F411 Generalized anxiety disorder: Secondary | ICD-10-CM | POA: Insufficient documentation

## 2018-09-17 DIAGNOSIS — G4701 Insomnia due to medical condition: Secondary | ICD-10-CM

## 2018-09-17 MED ORDER — LAMOTRIGINE 25 MG PO TABS: 50.0000 mg | ORAL_TABLET | Freq: Every day | ORAL | 1 refills | Status: AC

## 2018-09-17 MED ORDER — ZOLPIDEM TARTRATE 10 MG PO TABS: ORAL_TABLET | ORAL | 3 refills | Status: AC

## 2018-09-17 MED ORDER — PROPRANOLOL HCL 10 MG PO TABS: 10.0000 mg | ORAL_TABLET | Freq: Three times a day (TID) | ORAL | 1 refills | Status: AC | PRN

## 2018-09-17 MED ORDER — FLUOXETINE HCL 40 MG PO CAPS: 40.0000 mg | ORAL_CAPSULE | Freq: Every day | ORAL | 0 refills | Status: AC

## 2018-09-17 NOTE — Progress Notes (Signed)
Virtual Visit via Video Note  I connected with Meredith Williams on 09/17/18 at 10:30 AM EDT by a video enabled telemedicine application and verified that I am speaking with the correct person using two identifiers.   I discussed the limitations of evaluation and management by telemedicine and the availability of in person appointments. The patient expressed understanding and agreed to proceed.   I discussed the assessment and treatment plan with the patient. The patient was provided an opportunity to ask questions and all were answered. The patient agreed with the plan and demonstrated an understanding of the instructions.   The patient was advised to call back or seek an in-person evaluation if the symptoms worsen or if the condition fails to improve as anticipated.   [facility] MD OP Progress Note  09/17/2018 6:05 PM Meredith Williams  MRN:  [id number]  Chief Complaint:  Chief Complaint    Follow-up     VQQ:VZDGL is a 38 year old Caucasian female, on SSD, has a history of MDD, GAD, social anxiety disorder, insomnia, myotonic dystrophy, diabetes melitis, migraine headaches was evaluated by telemedicine today.  Patient today reports she is currently staying with her new boyfriend in [place].  She reports her boyfriend is very supportive and takes care of her.  She has has been feeling much better emotionally.  She denies any depressive symptoms.  She reports sleep is good on the Ambien.  She has been trying to lose weight and has been successful.  She is happy about that.  She denies any suicidality, homicidality or perceptual disturbances.  She reports she was taken off of several medications including metformin since her blood sugar level is more under control.  She denies any other concerns today. Visit Diagnosis:    ICD-10-CM   1. MDD (major depressive disorder), recurrent, in partial remission (HCC)  F33.41 FLUoxetine (PROZAC) 40 MG capsule    lamoTRIgine (LAMICTAL) 25 MG tablet  2. GAD  (generalized anxiety disorder)  F41.1 FLUoxetine (PROZAC) 40 MG capsule    lamoTRIgine (LAMICTAL) 25 MG tablet    propranolol (INDERAL) 10 MG tablet  3. Social anxiety disorder  F40.10 propranolol (INDERAL) 10 MG tablet  4. Insomnia due to medical condition  G47.01 zolpidem (AMBIEN) 10 MG tablet    Past Psychiatric History: I have reviewed past psychiatric history from my progress note on 04/16/2017.  Past trials of Cymbalta-ineffective, Viibryd, BuSpar, Effexor  Past Medical History:  Past Medical History:  Diagnosis Date  . Anxiety   . Depression   . Diabetes mellitus without complication ([place])   . Hyperlipidemia   . Muscular dystrophy, myotonic ([place])   . Pancreatitis     Past Surgical History:  Procedure Laterality Date  . ABDOMINAL HYSTERECTOMY    . HIP SURGERY Right 2016   "removal of a bone growth"    Family Psychiatric History: I have reviewed family psychiatric history from my progress note on 04/16/2017.  Family History:  Family History  Problem Relation Age of Onset  . Stroke Mother   . Muscular dystrophy Mother   . Drug abuse Brother   . Depression Sister   . Anxiety disorder Sister   . Muscular dystrophy Sister   . Depression Sister     Social History: I have reviewed social history from my progress note on 04/16/2017. Social History   Socioeconomic History  . Marital status: Single    Spouse name: Not on file  . Number of children: 0  . Years of education: Not on file  .  Highest education level: Some college, no degree  Occupational History    Comment: disability  Social Needs  . Financial resource strain: Not hard at all  . Food insecurity    Worry: Never true    Inability: Never true  . Transportation needs    Medical: Yes    Non-medical: Yes  Tobacco Use  . Smoking status: Passive Smoke Exposure - Never Smoker  . Smokeless tobacco: Never Used  Substance and Sexual Activity  . Alcohol use: No    Alcohol/week: 0.0 standard drinks     Frequency: Never  . Drug use: No  . Sexual activity: Yes    Birth control/protection: Pill  Lifestyle  . Physical activity    Days per week: Not on file    Minutes per session: Not on file  . Stress: Very much  Relationships  . Social connections    Talks on phone: More than three times a week    Gets together: More than three times a week    Attends religious service: Never    Active member of club or organization: No    Attends meetings of clubs or organizations: Never    Relationship status: Never married  Other Topics Concern  . Not on file  Social History Narrative   Patient went to the ED d/t pain in her side.  Every time she eats, she has increased pain.  Ob/gyn reported after CT that she had an ovarian cyst.  She will be going ob/gyn today for an injection to help with this.     Allergies:  Allergies  Allergen Reactions  . Other Other (See Comments)    Patient states that she is allergic to any medication that is in a patch form as it will "go into her muscle."    Metabolic Disorder Labs: No results found for: HGBA1C, MPG No results found for: PROLACTIN Lab Results  Component Value Date   CHOL 168 06/15/2016   TRIG 285 (H) 06/15/2016   HDL 44 06/15/2016   CHOLHDL 3.8 06/15/2016   VLDL 57 (H) 06/15/2016   LDLCALC 67 06/15/2016   Lab Results  Component Value Date   TSH 0.900 03/07/2018    Therapeutic Level Labs: No results found for: LITHIUM No results found for: VALPROATE No components found for:  CBMZ  Current Medications: Current Outpatient Medications  Medication Sig Dispense Refill  . Insulin Pen Needle (FIFTY50 PEN NEEDLES) 32G X 4 MM MISC Use 1 each once daily    . Lancets Misc. KIT Use 1 each once daily Use as instructed.    Marland Kitchen ACCU-CHEK AVIVA PLUS test strip TEST BLOOD SUGAR QD    . Accu-Chek Softclix Lancets lancets USE 1 QD TO CHECK BLOOD SUGAR UTD    . aspirin EC 81 MG tablet Take 81 mg by mouth daily.    . BD PEN NEEDLE NANO U/F 32G X 4 MM  MISC USE 1 NEEDLE ONCE D    . cyclobenzaprine (FLEXERIL) 10 MG tablet Take 15 mg twice a day    . FLUoxetine (PROZAC) 40 MG capsule Take 1 capsule (40 mg total) by mouth daily. 90 capsule 0  . fluticasone (FLONASE) 50 MCG/ACT nasal spray Place into the nose.    . furosemide (LASIX) 20 MG tablet TAKE 1 TABLET(20 MG) BY MOUTH EVERY DAY AS NEEDED FOR SWELLING    . JUNEL FE 24 1-20 MG-MCG(24) tablet Take 1 tablet by mouth daily.  0  . lamoTRIgine (LAMICTAL) 25 MG tablet Take  2 tablets (50 mg total) by mouth daily. 180 tablet 1  . metFORMIN (GLUCOPHAGE-XR) 500 MG 24 hr tablet Take by mouth.    . norgestrel-ethinyl estradiol (OGESTREL) 0.5-50 MG-MCG tablet Take by mouth.    . nortriptyline (PAMELOR) 50 MG capsule Take 50 mg by mouth at bedtime. Takes it as needed only for hotflashes    . oxymetazoline (NASAL RELIEF) 0.05 % nasal spray Place into the nose.    . propranolol (INDERAL) 10 MG tablet Take 1 tablet (10 mg total) by mouth 3 (three) times daily as needed. 270 tablet 1  . TRESIBA FLEXTOUCH 100 UNIT/ML SOPN FlexTouch Pen INJECT 50 UNITS SUBCUTANEOUSLY ONCE DAILY  11  . zolpidem (AMBIEN) 10 MG tablet TAKE 1 TABLET(10 MG) BY MOUTH AT BEDTIME AS NEEDED FOR SLEEP 30 tablet 3   No current facility-administered medications for this visit.      Musculoskeletal: Strength & Muscle Tone: UTA Gait & Station: Observed as laying on bed Patient leans: N/A  Psychiatric Specialty Exam: Review of Systems  Psychiatric/Behavioral: Negative for hallucinations and substance abuse. The patient is not nervous/anxious.   All other systems reviewed and are negative.   There were no vitals taken for this visit.There is no height or weight on file to calculate BMI.  General Appearance: Casual  Eye Contact:  Fair  Speech:  Normal Rate  Volume:  Normal  Mood:  Euthymic  Affect:  Congruent  Thought Process:  Goal Directed and Descriptions of Associations: Intact  Orientation:  Full (Time, Place, and Person)   Thought Content: Logical   Suicidal Thoughts:  No  Homicidal Thoughts:  No  Memory:  Immediate;   Fair Recent;   Fair Remote;   Fair  Judgement:  Fair  Insight:  Fair  Psychomotor Activity:  Normal  Concentration:  Concentration: Fair and Attention Span: Fair  Recall:  [employer] of Knowledge: Fair  Language: Fair  Akathisia:  No  Handed:  Right  AIMS (if indicated):denies tremors, rigidity  Assets:  Communication Skills Desire for Improvement Housing Intimacy Social Support  ADL's:  Intact  Cognition: WNL  Sleep:  Fair   Screenings: PHQ2-9     Clinical Support from 02/18/2018 in [facility] Office Visit from 09/18/2017 in [facility] Clinical Support from 08/06/2017 in [facility] Clinical Support from 04/11/2017 in [facility] from 12/25/2016 in [facility]  PHQ-2 Total Score  3  3  0  0  0  PHQ-9 Total Score  12  11  -  -  -       Assessment and Plan: Nitya is a 38 year old Caucasian female who has a history of MDD, insomnia, anxiety, chronic pain, myotonic dystrophy, diabetes, migraine headaches was evaluated by telemedicine today.  Patient is currently doing well on the current medication regimen.  Plan MDD-in partial remission Prozac 40 mg p.o. daily Lamictal 50 mg p.o. daily Pamelor 50 mg as needed that she takes for hot flashes per neurology.  For anxiety disorder-stable Propranolol 10 mg p.o. 3 times daily as needed-rarely uses it.  Prozac as prescribed.  For insomnia-stable Ambien 10 mg p.o. nightly as needed Continue CPAP  Follow-up in clinic in 2 to 3 months or sooner if needed.  November 4 th at 10:45 AM  I have spent atleast 15 minutes non face to face with patient  today. More than 50 % of the time was spent for  psychoeducation and supportive psychotherapy and care coordination.  This note was generated in part or whole with voice recognition software. Voice recognition is usually quite accurate but there are transcription errors that can and very often do occur. I apologize for any typographical errors that were not detected and corrected.       Ursula Alert, MD 09/17/2018, 6:05 PM

## 2019-10-01 IMAGING — CR DG SHOULDER 2+V*R*
1 series · 3 of 3 positions shown · non-contrast
Comparison: None.

CLINICAL DATA: Right shoulder pain after fall down stairs.

EXAM:
RIGHT SHOULDER - 2+ VIEW

[Series 1: dg shoulder right · 0.14mm/px · 3 of 3 slices shown]
[im 1/3]
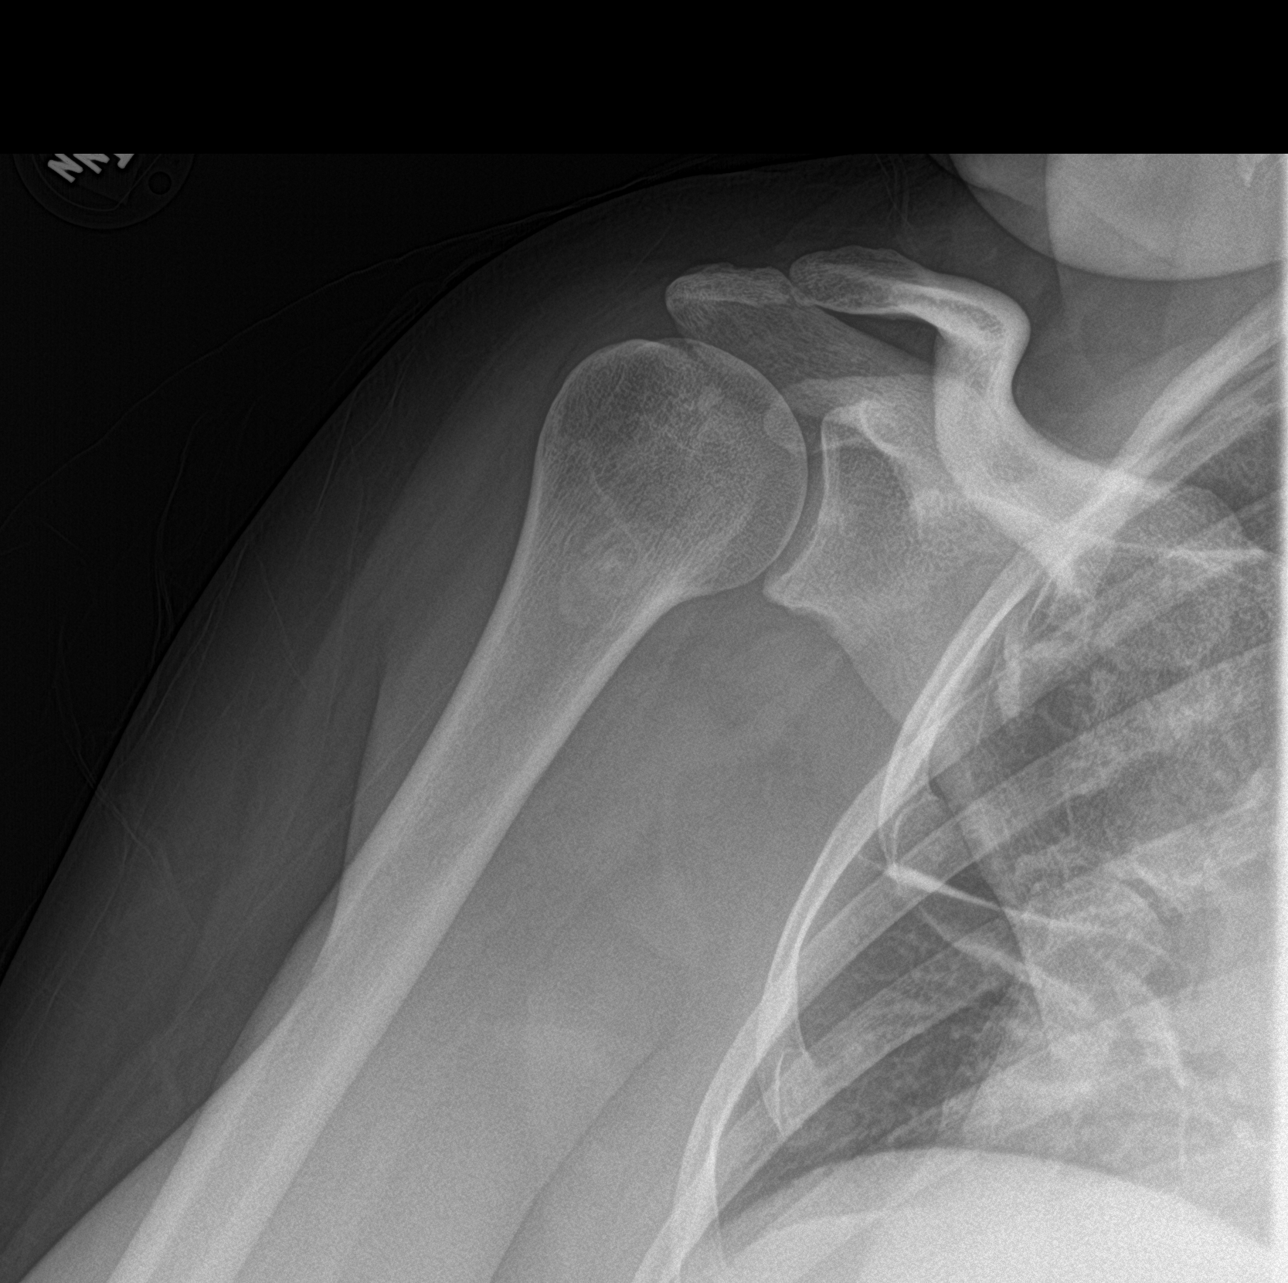
[im 2/3]
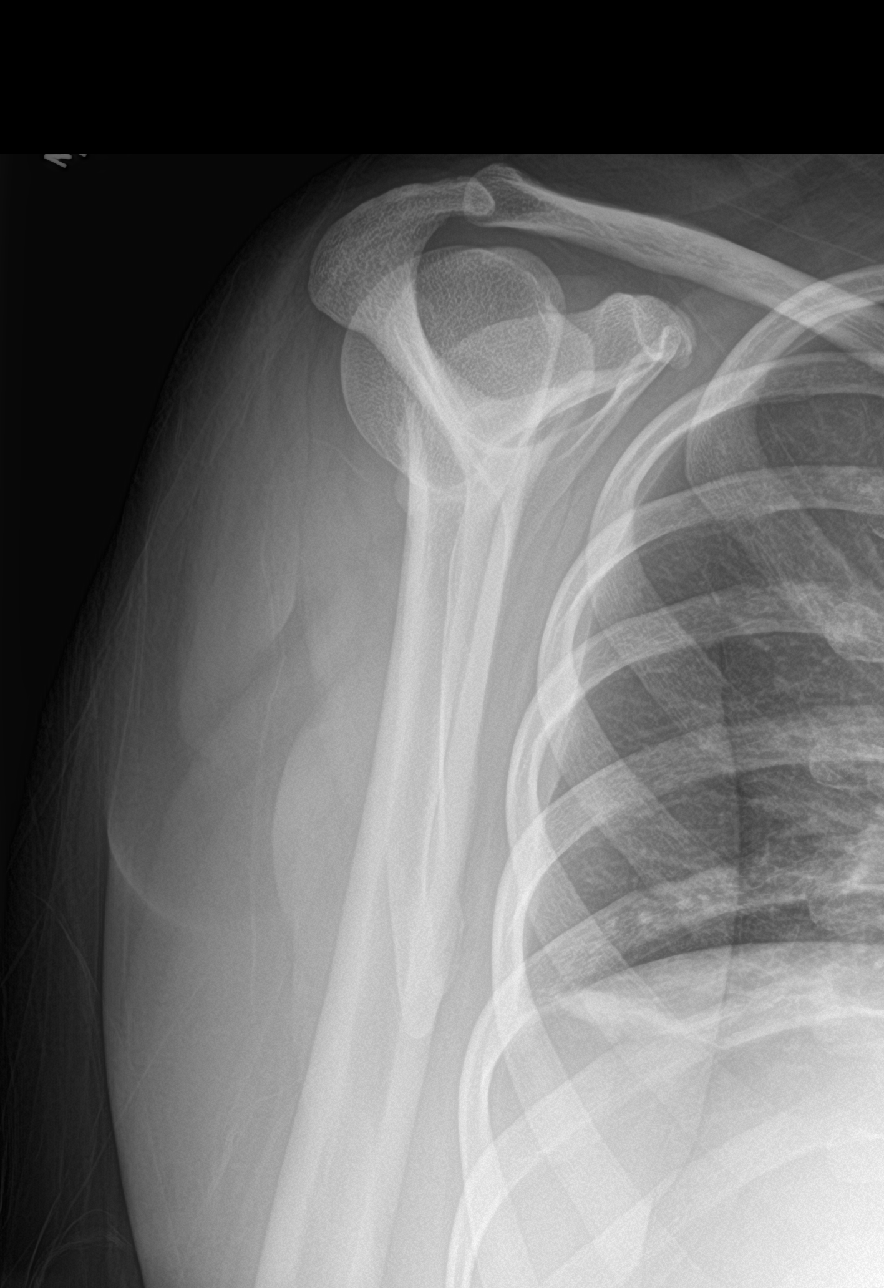
[im 3/3]
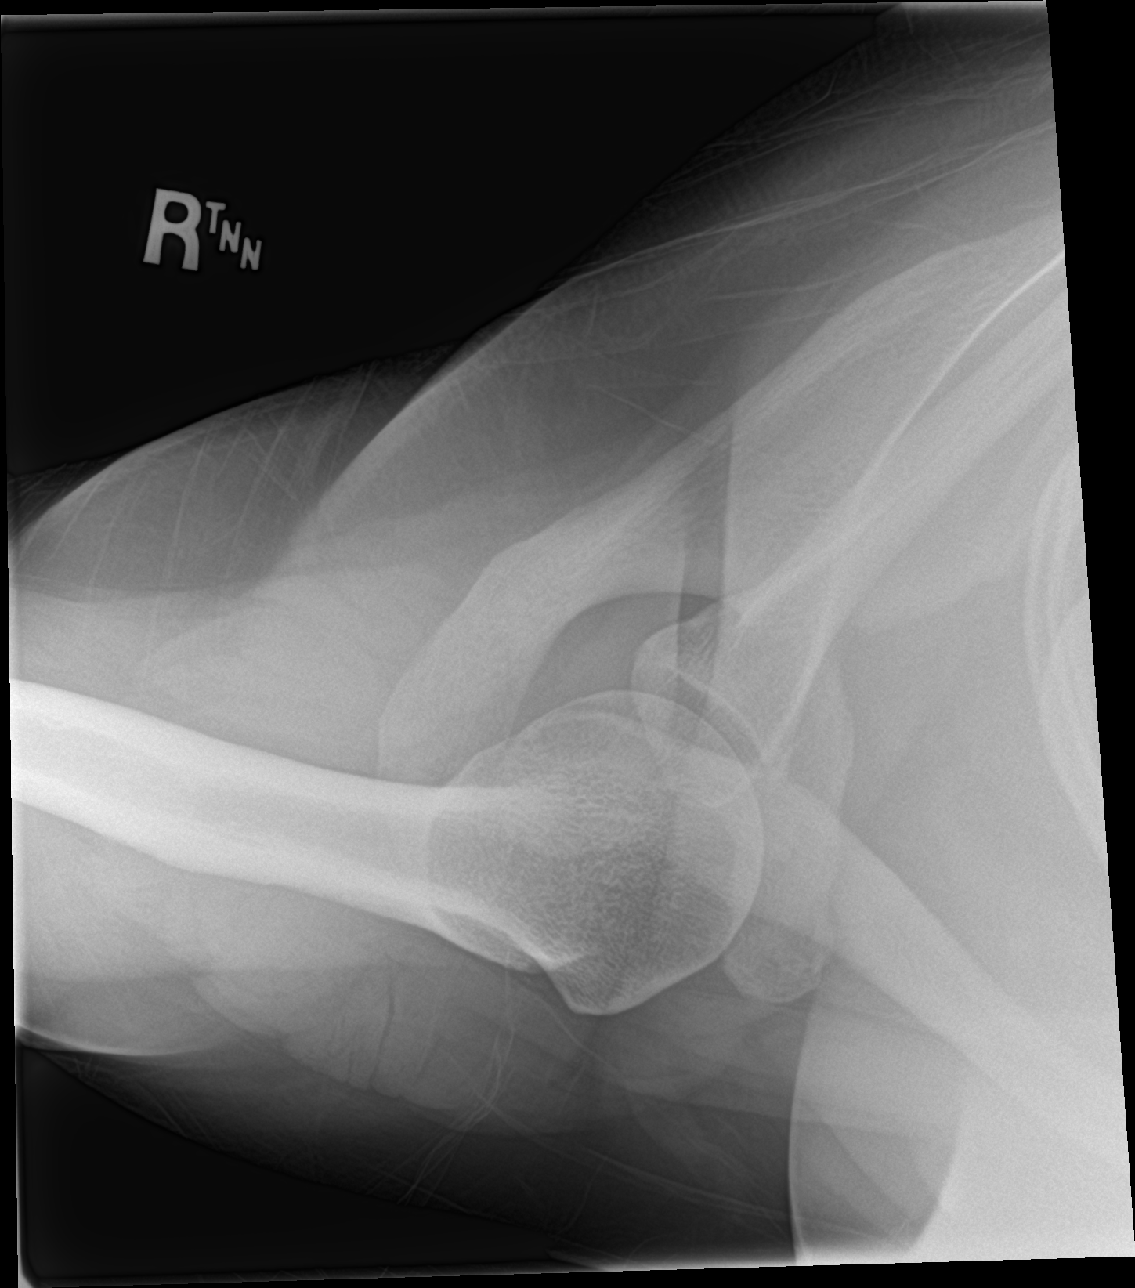

[3 of 3 positions shown; findings below may reference images not displayed]

FINDINGS: There is no evidence of fracture or dislocation. There is no
evidence of arthropathy or other focal bone abnormality. Soft
tissues are unremarkable.
IMPRESSION: Negative.
